# Patient Record
Sex: Male | Born: 1940 | Race: White | Hispanic: No | Marital: Married | Smoking: Former smoker
Health system: Southern US, Community
[De-identification: ages and names within clinical notes are randomized; demographics above are authoritative.]

## PROBLEM LIST (undated history)

## (undated) DIAGNOSIS — IMO0002 Reserved for concepts with insufficient information to code with codable children: Secondary | ICD-10-CM

## (undated) DIAGNOSIS — Z5189 Encounter for other specified aftercare: Secondary | ICD-10-CM

## (undated) DIAGNOSIS — Z860100 Personal history of colon polyps, unspecified: Secondary | ICD-10-CM

## (undated) DIAGNOSIS — K219 Gastro-esophageal reflux disease without esophagitis: Secondary | ICD-10-CM

## (undated) DIAGNOSIS — S42309A Unspecified fracture of shaft of humerus, unspecified arm, initial encounter for closed fracture: Secondary | ICD-10-CM

## (undated) DIAGNOSIS — R972 Elevated prostate specific antigen [PSA]: Secondary | ICD-10-CM

## (undated) DIAGNOSIS — N189 Chronic kidney disease, unspecified: Secondary | ICD-10-CM

## (undated) DIAGNOSIS — G709 Myoneural disorder, unspecified: Secondary | ICD-10-CM

## (undated) DIAGNOSIS — M419 Scoliosis, unspecified: Secondary | ICD-10-CM

## (undated) DIAGNOSIS — K625 Hemorrhage of anus and rectum: Secondary | ICD-10-CM

## (undated) DIAGNOSIS — K279 Peptic ulcer, site unspecified, unspecified as acute or chronic, without hemorrhage or perforation: Secondary | ICD-10-CM

## (undated) DIAGNOSIS — I499 Cardiac arrhythmia, unspecified: Secondary | ICD-10-CM

## (undated) DIAGNOSIS — Z87442 Personal history of urinary calculi: Secondary | ICD-10-CM

## (undated) DIAGNOSIS — J449 Chronic obstructive pulmonary disease, unspecified: Secondary | ICD-10-CM

## (undated) DIAGNOSIS — IMO0001 Reserved for inherently not codable concepts without codable children: Secondary | ICD-10-CM

## (undated) DIAGNOSIS — E785 Hyperlipidemia, unspecified: Secondary | ICD-10-CM

## (undated) DIAGNOSIS — K5730 Diverticulosis of large intestine without perforation or abscess without bleeding: Secondary | ICD-10-CM

## (undated) DIAGNOSIS — K922 Gastrointestinal hemorrhage, unspecified: Secondary | ICD-10-CM

## (undated) DIAGNOSIS — E119 Type 2 diabetes mellitus without complications: Secondary | ICD-10-CM

## (undated) DIAGNOSIS — M1990 Unspecified osteoarthritis, unspecified site: Secondary | ICD-10-CM

## (undated) DIAGNOSIS — K921 Melena: Secondary | ICD-10-CM

## (undated) DIAGNOSIS — K5720 Diverticulitis of large intestine with perforation and abscess without bleeding: Secondary | ICD-10-CM

## (undated) DIAGNOSIS — I10 Essential (primary) hypertension: Secondary | ICD-10-CM

## (undated) DIAGNOSIS — H9210 Otorrhea, unspecified ear: Secondary | ICD-10-CM

## (undated) DIAGNOSIS — Z87440 Personal history of urinary (tract) infections: Secondary | ICD-10-CM

## (undated) DIAGNOSIS — G4730 Sleep apnea, unspecified: Secondary | ICD-10-CM

## (undated) DIAGNOSIS — M069 Rheumatoid arthritis, unspecified: Secondary | ICD-10-CM

## (undated) HISTORY — DX: Personal history of urinary (tract) infections: Z87.440

## (undated) HISTORY — DX: Reserved for concepts with insufficient information to code with codable children: IMO0002

## (undated) HISTORY — DX: Diverticulosis of large intestine without perforation or abscess without bleeding: K57.30

## (undated) HISTORY — DX: Gastro-esophageal reflux disease without esophagitis: K21.9

## (undated) HISTORY — DX: Melena: K92.1

## (undated) HISTORY — DX: Elevated prostate specific antigen (PSA): R97.20

## (undated) HISTORY — DX: Type 2 diabetes mellitus without complications: E11.9

## (undated) HISTORY — DX: Personal history of urinary calculi: Z87.442

## (undated) HISTORY — DX: Personal history of colon polyps, unspecified: Z86.0100

## (undated) HISTORY — DX: Diverticulitis of large intestine with perforation and abscess without bleeding: K57.20

## (undated) HISTORY — DX: Chronic kidney disease, unspecified: N18.9

## (undated) HISTORY — DX: Unspecified osteoarthritis, unspecified site: M19.90

## (undated) HISTORY — DX: Chronic obstructive pulmonary disease, unspecified: J44.9

## (undated) HISTORY — DX: Peptic ulcer, site unspecified, unspecified as acute or chronic, without hemorrhage or perforation: K27.9

## (undated) HISTORY — DX: Essential (primary) hypertension: I10

## (undated) HISTORY — DX: Hemorrhage of anus and rectum: K62.5

## (undated) HISTORY — DX: Hyperlipidemia, unspecified: E78.5

## (undated) HISTORY — PX: OTHER SURGICAL HISTORY: SHX169

## (undated) HISTORY — DX: Encounter for other specified aftercare: Z51.89

## (undated) HISTORY — DX: Gastrointestinal hemorrhage, unspecified: K92.2

## (undated) HISTORY — DX: Personal history of colonic polyps: Z86.010

## (undated) HISTORY — DX: Unspecified fracture of shaft of humerus, unspecified arm, initial encounter for closed fracture: S42.309A

## (undated) HISTORY — DX: Rheumatoid arthritis, unspecified: M06.9

## (undated) HISTORY — DX: Sleep apnea, unspecified: G47.30

## (undated) HISTORY — DX: Reserved for inherently not codable concepts without codable children: IMO0001

## (undated) HISTORY — DX: Otorrhea, unspecified ear: H92.10

---
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
## 1959-03-20 HISTORY — PX: OTHER SURGICAL HISTORY: SHX169

## 2006-12-11 ENCOUNTER — Ambulatory Visit: Payer: Self-pay | Admitting: Internal Medicine

## 2006-12-11 ENCOUNTER — Encounter: Payer: Self-pay | Admitting: Internal Medicine

## 2006-12-11 DIAGNOSIS — E119 Type 2 diabetes mellitus without complications: Secondary | ICD-10-CM

## 2006-12-11 DIAGNOSIS — Z87442 Personal history of urinary calculi: Secondary | ICD-10-CM | POA: Insufficient documentation

## 2006-12-11 DIAGNOSIS — Z860100 Personal history of colon polyps, unspecified: Secondary | ICD-10-CM | POA: Insufficient documentation

## 2006-12-11 DIAGNOSIS — K5730 Diverticulosis of large intestine without perforation or abscess without bleeding: Secondary | ICD-10-CM | POA: Insufficient documentation

## 2006-12-11 DIAGNOSIS — K279 Peptic ulcer, site unspecified, unspecified as acute or chronic, without hemorrhage or perforation: Secondary | ICD-10-CM | POA: Insufficient documentation

## 2006-12-11 DIAGNOSIS — M069 Rheumatoid arthritis, unspecified: Secondary | ICD-10-CM | POA: Insufficient documentation

## 2006-12-11 DIAGNOSIS — I10 Essential (primary) hypertension: Secondary | ICD-10-CM

## 2006-12-11 LAB — CONVERTED CEMR LAB
Albumin: 4.2 g/dL (ref 3.5–5.2)
Alkaline Phosphatase: 80 units/L (ref 39–117)
BUN: 23 mg/dL (ref 6–23)
Chloride: 105 meq/L (ref 96–112)
Cholesterol: 118 mg/dL (ref 0–200)
Creatinine, Ser: 1.2 mg/dL (ref 0.4–1.5)
Hgb A1c MFr Bld: 6.7 % — ABNORMAL HIGH (ref 4.6–6.0)
LDL Cholesterol: 72 mg/dL (ref 0–99)
Potassium: 4.1 meq/L (ref 3.5–5.1)
Sodium: 142 meq/L (ref 135–145)
Total Bilirubin: 0.9 mg/dL (ref 0.3–1.2)
VLDL: 12 mg/dL (ref 0–40)

## 2006-12-13 DIAGNOSIS — E785 Hyperlipidemia, unspecified: Secondary | ICD-10-CM | POA: Insufficient documentation

## 2006-12-26 ENCOUNTER — Ambulatory Visit: Payer: Self-pay

## 2007-01-21 ENCOUNTER — Ambulatory Visit: Payer: Self-pay | Admitting: Internal Medicine

## 2007-04-15 ENCOUNTER — Encounter: Payer: Self-pay | Admitting: Internal Medicine

## 2007-04-16 DIAGNOSIS — Z9089 Acquired absence of other organs: Secondary | ICD-10-CM | POA: Insufficient documentation

## 2007-04-16 DIAGNOSIS — H9210 Otorrhea, unspecified ear: Secondary | ICD-10-CM | POA: Insufficient documentation

## 2007-04-16 DIAGNOSIS — S42309A Unspecified fracture of shaft of humerus, unspecified arm, initial encounter for closed fracture: Secondary | ICD-10-CM

## 2007-04-16 DIAGNOSIS — K219 Gastro-esophageal reflux disease without esophagitis: Secondary | ICD-10-CM

## 2007-04-16 DIAGNOSIS — N390 Urinary tract infection, site not specified: Secondary | ICD-10-CM

## 2007-04-16 DIAGNOSIS — N189 Chronic kidney disease, unspecified: Secondary | ICD-10-CM | POA: Insufficient documentation

## 2007-04-17 ENCOUNTER — Ambulatory Visit: Payer: Self-pay | Admitting: Internal Medicine

## 2007-04-17 DIAGNOSIS — IMO0001 Reserved for inherently not codable concepts without codable children: Secondary | ICD-10-CM

## 2007-04-25 ENCOUNTER — Ambulatory Visit: Payer: Self-pay | Admitting: Internal Medicine

## 2007-04-25 LAB — CONVERTED CEMR LAB: BUN: 21 mg/dL (ref 6–23)

## 2007-04-29 ENCOUNTER — Encounter: Admission: RE | Admit: 2007-04-29 | Discharge: 2007-04-29 | Payer: Self-pay | Admitting: Internal Medicine

## 2007-05-15 ENCOUNTER — Encounter: Payer: Self-pay | Admitting: Internal Medicine

## 2007-07-14 ENCOUNTER — Ambulatory Visit: Payer: Self-pay | Admitting: Internal Medicine

## 2007-07-14 LAB — CONVERTED CEMR LAB
ALT: 23 units/L (ref 0–53)
Albumin: 4.1 g/dL (ref 3.5–5.2)
Bilirubin, Direct: 0.1 mg/dL (ref 0.0–0.3)
CO2: 29 meq/L (ref 19–32)
Calcium: 9.2 mg/dL (ref 8.4–10.5)
Chloride: 109 meq/L (ref 96–112)
Glucose, Bld: 142 mg/dL — ABNORMAL HIGH (ref 70–99)
Hgb A1c MFr Bld: 6.3 % — ABNORMAL HIGH (ref 4.6–6.0)
LDL Cholesterol: 126 mg/dL — ABNORMAL HIGH (ref 0–99)
Potassium: 4.5 meq/L (ref 3.5–5.1)
Sodium: 142 meq/L (ref 135–145)
Total CHOL/HDL Ratio: 5.4
Total Protein: 6.7 g/dL (ref 6.0–8.3)
VLDL: 15 mg/dL (ref 0–40)

## 2007-07-17 ENCOUNTER — Ambulatory Visit: Payer: Self-pay | Admitting: Internal Medicine

## 2007-07-17 DIAGNOSIS — E663 Overweight: Secondary | ICD-10-CM | POA: Insufficient documentation

## 2007-08-29 ENCOUNTER — Telehealth: Payer: Self-pay | Admitting: Internal Medicine

## 2007-09-30 ENCOUNTER — Ambulatory Visit: Payer: Self-pay | Admitting: Internal Medicine

## 2007-11-13 ENCOUNTER — Encounter: Payer: Self-pay | Admitting: Internal Medicine

## 2008-01-15 ENCOUNTER — Ambulatory Visit: Payer: Self-pay | Admitting: Internal Medicine

## 2008-01-15 LAB — CONVERTED CEMR LAB
AST: 20 units/L (ref 0–37)
Alkaline Phosphatase: 97 units/L (ref 39–117)
BUN: 24 mg/dL — ABNORMAL HIGH (ref 6–23)
Basophils Absolute: 0 10*3/uL (ref 0.0–0.1)
Basophils Relative: 0.5 % (ref 0.0–3.0)
Bilirubin Urine: NEGATIVE
CO2: 31 meq/L (ref 19–32)
Cholesterol: 100 mg/dL (ref 0–200)
Eosinophils Relative: 4.1 % (ref 0.0–5.0)
GFR calc Af Amer: 65 mL/min
GFR calc non Af Amer: 54 mL/min
Glucose, Bld: 111 mg/dL — ABNORMAL HIGH (ref 70–99)
Hemoglobin: 14.3 g/dL (ref 13.0–17.0)
Ketones, ur: NEGATIVE mg/dL
LDL Cholesterol: 57 mg/dL (ref 0–99)
MCHC: 34.1 g/dL (ref 30.0–36.0)
MCV: 98.7 fL (ref 78.0–100.0)
Monocytes Relative: 6.9 % (ref 3.0–12.0)
Neutrophils Relative %: 66.3 % (ref 43.0–77.0)
RBC: 4.27 M/uL (ref 4.22–5.81)
RDW: 12.8 % (ref 11.5–14.6)
Sodium: 142 meq/L (ref 135–145)
TSH: 1.36 microintl units/mL (ref 0.35–5.50)
Total CHOL/HDL Ratio: 3.2
Total Protein, Urine: NEGATIVE mg/dL
Triglycerides: 57 mg/dL (ref 0–149)
WBC: 6.6 10*3/uL (ref 4.5–10.5)
pH: 6 (ref 5.0–8.0)

## 2008-01-18 ENCOUNTER — Encounter: Payer: Self-pay | Admitting: Internal Medicine

## 2008-01-22 ENCOUNTER — Ambulatory Visit: Payer: Self-pay | Admitting: Internal Medicine

## 2008-02-15 ENCOUNTER — Inpatient Hospital Stay (HOSPITAL_COMMUNITY): Admission: EM | Admit: 2008-02-15 | Discharge: 2008-02-16 | Payer: Self-pay | Admitting: Emergency Medicine

## 2008-02-20 ENCOUNTER — Ambulatory Visit: Payer: Self-pay | Admitting: Internal Medicine

## 2008-02-20 DIAGNOSIS — K922 Gastrointestinal hemorrhage, unspecified: Secondary | ICD-10-CM | POA: Insufficient documentation

## 2008-02-25 ENCOUNTER — Telehealth: Payer: Self-pay | Admitting: Internal Medicine

## 2008-04-12 ENCOUNTER — Encounter: Payer: Self-pay | Admitting: Internal Medicine

## 2008-04-19 ENCOUNTER — Encounter: Payer: Self-pay | Admitting: Internal Medicine

## 2008-07-05 ENCOUNTER — Telehealth: Payer: Self-pay | Admitting: Internal Medicine

## 2008-07-12 ENCOUNTER — Ambulatory Visit: Payer: Self-pay | Admitting: Internal Medicine

## 2008-07-12 LAB — CONVERTED CEMR LAB
AST: 17 units/L (ref 0–37)
Alkaline Phosphatase: 96 units/L (ref 39–117)
Bilirubin, Direct: 0.2 mg/dL (ref 0.0–0.3)
CO2: 28 meq/L (ref 19–32)
Calcium: 9.3 mg/dL (ref 8.4–10.5)
Chloride: 109 meq/L (ref 96–112)
Cholesterol: 111 mg/dL (ref 0–200)
GFR calc non Af Amer: 45.83 mL/min (ref >60)
HDL: 34.1 mg/dL — ABNORMAL LOW (ref >39.00)
Hgb A1c MFr Bld: 6.3 % (ref 4.6–6.5)
LDL Cholesterol: 69 mg/dL (ref 0–99)
Potassium: 4.8 meq/L (ref 3.5–5.1)
Total Bilirubin: 0.7 mg/dL (ref 0.3–1.2)
Total CHOL/HDL Ratio: 3

## 2008-07-20 ENCOUNTER — Telehealth: Payer: Self-pay | Admitting: Internal Medicine

## 2008-07-21 ENCOUNTER — Ambulatory Visit: Payer: Self-pay | Admitting: Internal Medicine

## 2008-07-21 ENCOUNTER — Encounter: Payer: Self-pay | Admitting: Internal Medicine

## 2008-07-28 ENCOUNTER — Ambulatory Visit: Payer: Self-pay | Admitting: Internal Medicine

## 2008-08-06 ENCOUNTER — Encounter: Payer: Self-pay | Admitting: Pulmonary Disease

## 2008-08-06 ENCOUNTER — Ambulatory Visit (HOSPITAL_BASED_OUTPATIENT_CLINIC_OR_DEPARTMENT_OTHER): Admission: RE | Admit: 2008-08-06 | Discharge: 2008-08-06 | Payer: Self-pay | Admitting: Internal Medicine

## 2008-08-06 ENCOUNTER — Encounter: Payer: Self-pay | Admitting: Internal Medicine

## 2008-08-09 ENCOUNTER — Ambulatory Visit: Payer: Self-pay | Admitting: Pulmonary Disease

## 2008-08-17 ENCOUNTER — Ambulatory Visit: Payer: Self-pay | Admitting: Internal Medicine

## 2008-08-19 ENCOUNTER — Ambulatory Visit: Payer: Self-pay | Admitting: Internal Medicine

## 2008-08-19 ENCOUNTER — Encounter: Payer: Self-pay | Admitting: Internal Medicine

## 2008-08-19 LAB — CONVERTED CEMR LAB
Basophils Relative: 0 % (ref 0.0–3.0)
C3 Complement: 127 mg/dL (ref 88–201)
Hemoglobin: 14.1 g/dL (ref 13.0–17.0)
Lymphs Abs: 1.5 10*3/uL (ref 0.7–4.0)
MCHC: 34.4 g/dL (ref 30.0–36.0)
MCV: 96.3 fL (ref 78.0–100.0)
Monocytes Relative: 8.7 % (ref 3.0–12.0)
Neutro Abs: 4.3 10*3/uL (ref 1.4–7.7)
RBC: 4.27 M/uL (ref 4.22–5.81)
WBC: 6.6 10*3/uL (ref 4.5–10.5)

## 2008-08-20 ENCOUNTER — Telehealth: Payer: Self-pay | Admitting: Internal Medicine

## 2008-08-20 DIAGNOSIS — G4733 Obstructive sleep apnea (adult) (pediatric): Secondary | ICD-10-CM | POA: Insufficient documentation

## 2008-08-23 ENCOUNTER — Ambulatory Visit: Payer: Self-pay | Admitting: Internal Medicine

## 2008-08-23 LAB — CONVERTED CEMR LAB
Collection Interval-CRCL: 24 hr
Creatinine 24 HR UR: 2387 mg/24hr — ABNORMAL HIGH (ref 800–2000)
Creatinine Clearance: 112 mL/min (ref 75–125)
Creatinine, Urine: 82.3 mg/dL
INR: 1 (ref 0.8–1.0)
Total Protein, Urine: 2

## 2008-08-26 ENCOUNTER — Ambulatory Visit: Payer: Self-pay | Admitting: Internal Medicine

## 2008-08-30 ENCOUNTER — Encounter (INDEPENDENT_AMBULATORY_CARE_PROVIDER_SITE_OTHER): Payer: Self-pay | Admitting: *Deleted

## 2008-08-30 ENCOUNTER — Encounter: Payer: Self-pay | Admitting: Internal Medicine

## 2008-09-27 ENCOUNTER — Encounter: Payer: Self-pay | Admitting: Internal Medicine

## 2008-10-20 ENCOUNTER — Encounter: Payer: Self-pay | Admitting: Internal Medicine

## 2008-10-27 ENCOUNTER — Encounter: Payer: Self-pay | Admitting: Internal Medicine

## 2008-10-28 ENCOUNTER — Telehealth: Payer: Self-pay | Admitting: Internal Medicine

## 2008-10-29 ENCOUNTER — Encounter (INDEPENDENT_AMBULATORY_CARE_PROVIDER_SITE_OTHER): Payer: Self-pay | Admitting: *Deleted

## 2008-11-01 ENCOUNTER — Telehealth: Payer: Self-pay | Admitting: Internal Medicine

## 2008-11-08 ENCOUNTER — Ambulatory Visit: Payer: Self-pay | Admitting: Internal Medicine

## 2008-11-08 ENCOUNTER — Telehealth: Payer: Self-pay | Admitting: Internal Medicine

## 2008-11-08 DIAGNOSIS — R1084 Generalized abdominal pain: Secondary | ICD-10-CM

## 2008-11-08 LAB — CONVERTED CEMR LAB
Alkaline Phosphatase: 86 units/L (ref 39–117)
BUN: 36 mg/dL — ABNORMAL HIGH (ref 6–23)
Basophils Relative: 0.7 % (ref 0.0–3.0)
Bilirubin Urine: NEGATIVE
CO2: 29 meq/L (ref 19–32)
Calcium: 9.1 mg/dL (ref 8.4–10.5)
Chloride: 102 meq/L (ref 96–112)
Eosinophils Absolute: 0.1 10*3/uL (ref 0.0–0.7)
Eosinophils Relative: 0.4 % (ref 0.0–5.0)
GFR calc non Af Amer: 42.69 mL/min (ref >60)
Glucose, Bld: 121 mg/dL — ABNORMAL HIGH (ref 70–99)
HDL goal, serum: 40 mg/dL
HDL: 46.7 mg/dL (ref >39.00)
Hemoglobin, Urine: NEGATIVE
Hemoglobin: 13.5 g/dL (ref 13.0–17.0)
LDL Cholesterol: 45 mg/dL (ref 0–99)
LDL Goal: 100 mg/dL
MCV: 98.3 fL (ref 78.0–100.0)
Nitrite: NEGATIVE
RDW: 13.4 % (ref 11.5–14.6)
Sodium: 139 meq/L (ref 135–145)
TSH: 0.98 microintl units/mL (ref 0.35–5.50)
Total CHOL/HDL Ratio: 2
Triglycerides: 54 mg/dL (ref 0.0–149.0)
Urine Glucose: NEGATIVE mg/dL

## 2008-11-09 ENCOUNTER — Encounter: Payer: Self-pay | Admitting: Internal Medicine

## 2008-11-09 ENCOUNTER — Ambulatory Visit: Payer: Self-pay | Admitting: Pulmonary Disease

## 2008-11-09 LAB — CONVERTED CEMR LAB: Lipase: 13 units/L (ref 0–75)

## 2008-11-10 ENCOUNTER — Telehealth: Payer: Self-pay | Admitting: Internal Medicine

## 2008-11-10 ENCOUNTER — Encounter (INDEPENDENT_AMBULATORY_CARE_PROVIDER_SITE_OTHER): Payer: Self-pay | Admitting: General Surgery

## 2008-11-10 ENCOUNTER — Encounter: Payer: Self-pay | Admitting: Internal Medicine

## 2008-11-10 ENCOUNTER — Inpatient Hospital Stay (HOSPITAL_COMMUNITY): Admission: EM | Admit: 2008-11-10 | Discharge: 2008-11-18 | Payer: Self-pay | Admitting: Emergency Medicine

## 2008-11-10 ENCOUNTER — Ambulatory Visit: Payer: Self-pay | Admitting: Internal Medicine

## 2008-11-16 ENCOUNTER — Telehealth: Payer: Self-pay | Admitting: Pulmonary Disease

## 2008-11-25 ENCOUNTER — Telehealth: Payer: Self-pay | Admitting: Internal Medicine

## 2008-11-25 ENCOUNTER — Ambulatory Visit: Payer: Self-pay | Admitting: Internal Medicine

## 2008-11-25 LAB — CONVERTED CEMR LAB
CO2: 28 meq/L (ref 19–32)
Calcium: 9.1 mg/dL (ref 8.4–10.5)
Creatinine, Ser: 1.5 mg/dL (ref 0.4–1.5)

## 2008-12-30 ENCOUNTER — Ambulatory Visit: Payer: Self-pay | Admitting: Internal Medicine

## 2008-12-30 ENCOUNTER — Telehealth: Payer: Self-pay | Admitting: Pulmonary Disease

## 2009-01-13 ENCOUNTER — Ambulatory Visit: Payer: Self-pay | Admitting: Internal Medicine

## 2009-01-20 ENCOUNTER — Encounter: Admission: RE | Admit: 2009-01-20 | Discharge: 2009-01-20 | Payer: Self-pay | Admitting: General Surgery

## 2009-01-27 ENCOUNTER — Ambulatory Visit (HOSPITAL_BASED_OUTPATIENT_CLINIC_OR_DEPARTMENT_OTHER): Admission: RE | Admit: 2009-01-27 | Discharge: 2009-01-27 | Payer: Self-pay | Admitting: Pulmonary Disease

## 2009-01-27 ENCOUNTER — Encounter: Payer: Self-pay | Admitting: Pulmonary Disease

## 2009-01-27 ENCOUNTER — Encounter: Payer: Self-pay | Admitting: Internal Medicine

## 2009-01-31 ENCOUNTER — Encounter: Payer: Self-pay | Admitting: Internal Medicine

## 2009-02-01 ENCOUNTER — Encounter: Payer: Self-pay | Admitting: Internal Medicine

## 2009-02-07 ENCOUNTER — Ambulatory Visit: Payer: Self-pay | Admitting: Pulmonary Disease

## 2009-02-08 ENCOUNTER — Telehealth (INDEPENDENT_AMBULATORY_CARE_PROVIDER_SITE_OTHER): Payer: Self-pay | Admitting: *Deleted

## 2009-02-22 ENCOUNTER — Ambulatory Visit: Payer: Self-pay | Admitting: Pulmonary Disease

## 2009-02-25 ENCOUNTER — Inpatient Hospital Stay (HOSPITAL_COMMUNITY): Admission: RE | Admit: 2009-02-25 | Discharge: 2009-03-02 | Payer: Self-pay | Admitting: General Surgery

## 2009-02-25 ENCOUNTER — Telehealth: Payer: Self-pay | Admitting: Internal Medicine

## 2009-02-25 ENCOUNTER — Ambulatory Visit: Payer: Self-pay | Admitting: Internal Medicine

## 2009-02-25 ENCOUNTER — Encounter (INDEPENDENT_AMBULATORY_CARE_PROVIDER_SITE_OTHER): Payer: Self-pay | Admitting: General Surgery

## 2009-02-25 HISTORY — PX: EXPLORATORY LAPAROTOMY: SUR591

## 2009-03-06 ENCOUNTER — Emergency Department (HOSPITAL_COMMUNITY): Admission: EM | Admit: 2009-03-06 | Discharge: 2009-03-06 | Payer: Self-pay | Admitting: Emergency Medicine

## 2009-03-07 ENCOUNTER — Encounter: Payer: Self-pay | Admitting: Internal Medicine

## 2009-03-08 ENCOUNTER — Encounter: Payer: Self-pay | Admitting: Internal Medicine

## 2009-03-16 ENCOUNTER — Ambulatory Visit: Payer: Self-pay | Admitting: Internal Medicine

## 2009-03-16 LAB — CONVERTED CEMR LAB
BUN: 15 mg/dL (ref 6–23)
Basophils Relative: 0.9 % (ref 0.0–3.0)
Calcium: 9.2 mg/dL (ref 8.4–10.5)
Chloride: 104 meq/L (ref 96–112)
Eosinophils Absolute: 0.4 10*3/uL (ref 0.0–0.7)
Eosinophils Relative: 4.2 % (ref 0.0–5.0)
GFR calc non Af Amer: 58.12 mL/min (ref >60)
Glucose, Bld: 136 mg/dL — ABNORMAL HIGH (ref 70–99)
HCT: 34.4 % — ABNORMAL LOW (ref 39.0–52.0)
Hemoglobin: 11.7 g/dL — ABNORMAL LOW (ref 13.0–17.0)
Lymphocytes Relative: 13.7 % (ref 12.0–46.0)
Lymphs Abs: 1.4 10*3/uL (ref 0.7–4.0)
MCHC: 33.9 g/dL (ref 30.0–36.0)
Neutrophils Relative %: 76.1 % (ref 43.0–77.0)
Potassium: 4.3 meq/L (ref 3.5–5.1)
RDW: 13.1 % (ref 11.5–14.6)
Sodium: 141 meq/L (ref 135–145)
WBC: 10.4 10*3/uL (ref 4.5–10.5)

## 2009-03-17 ENCOUNTER — Encounter: Payer: Self-pay | Admitting: Internal Medicine

## 2009-03-19 HISTORY — PX: CATARACT EXTRACTION, BILATERAL: SHX1313

## 2009-03-19 HISTORY — PX: EXPLORATORY LAPAROTOMY W/ BOWEL RESECTION: SHX1544

## 2009-03-31 ENCOUNTER — Encounter: Payer: Self-pay | Admitting: Internal Medicine

## 2009-04-07 ENCOUNTER — Encounter: Payer: Self-pay | Admitting: Pulmonary Disease

## 2009-05-16 ENCOUNTER — Telehealth: Payer: Self-pay | Admitting: Internal Medicine

## 2009-05-27 ENCOUNTER — Encounter: Payer: Self-pay | Admitting: Internal Medicine

## 2009-06-03 ENCOUNTER — Encounter: Payer: Self-pay | Admitting: Internal Medicine

## 2009-07-06 ENCOUNTER — Telehealth: Payer: Self-pay | Admitting: Internal Medicine

## 2009-07-11 ENCOUNTER — Telehealth: Payer: Self-pay | Admitting: Internal Medicine

## 2009-07-13 ENCOUNTER — Telehealth: Payer: Self-pay | Admitting: Internal Medicine

## 2009-07-14 ENCOUNTER — Encounter: Payer: Self-pay | Admitting: Internal Medicine

## 2009-11-11 ENCOUNTER — Encounter: Payer: Self-pay | Admitting: Internal Medicine

## 2009-11-14 ENCOUNTER — Telehealth: Payer: Self-pay | Admitting: Internal Medicine

## 2009-11-17 ENCOUNTER — Ambulatory Visit: Payer: Self-pay | Admitting: Internal Medicine

## 2009-11-17 LAB — CONVERTED CEMR LAB
ALT: 20 units/L (ref 0–53)
Albumin: 4.3 g/dL (ref 3.5–5.2)
BUN: 24 mg/dL — ABNORMAL HIGH (ref 6–23)
Basophils Relative: 0.6 % (ref 0.0–3.0)
CO2: 28 meq/L (ref 19–32)
Cholesterol: 123 mg/dL (ref 0–200)
Eosinophils Absolute: 0.2 10*3/uL (ref 0.0–0.7)
Eosinophils Relative: 2.1 % (ref 0.0–5.0)
Glucose, Bld: 122 mg/dL — ABNORMAL HIGH (ref 70–99)
Hemoglobin: 13.9 g/dL (ref 13.0–17.0)
Hgb A1c MFr Bld: 6.6 % — ABNORMAL HIGH (ref 4.6–6.5)
LDL Cholesterol: 71 mg/dL (ref 0–99)
Lymphocytes Relative: 22.2 % (ref 12.0–46.0)
Monocytes Absolute: 0.5 10*3/uL (ref 0.1–1.0)
Potassium: 4.7 meq/L (ref 3.5–5.1)
Sodium: 140 meq/L (ref 135–145)
Total Bilirubin: 0.8 mg/dL (ref 0.3–1.2)
Total CHOL/HDL Ratio: 3
Total Protein: 6.6 g/dL (ref 6.0–8.3)
WBC: 7.2 10*3/uL (ref 4.5–10.5)

## 2009-11-21 ENCOUNTER — Encounter: Payer: Self-pay | Admitting: Internal Medicine

## 2009-12-08 ENCOUNTER — Telehealth: Payer: Self-pay | Admitting: Internal Medicine

## 2010-01-24 ENCOUNTER — Ambulatory Visit: Payer: Self-pay | Admitting: Internal Medicine

## 2010-03-04 ENCOUNTER — Ambulatory Visit: Payer: Self-pay | Admitting: Family Medicine

## 2010-04-18 NOTE — Progress Notes (Signed)
Summary: LABS   Phone Note Call from Patient   Summary of Call: Patient is requesting to come in for labs. He knows he is due for A1c and wants to know if there is anything else MD advises. Also has lab order from rheumatologist that he will also need.  Initial call taken by: Lamar Sprinkles, CMA,  November 14, 2009 9:51 AM  Follow-up for Phone Call        ok for labs: A1C 250.00, lipid panel 272.4, hepatic 995.20, CBC 995.20 Follow-up by: Jacques Navy MD,  November 14, 2009 12:32 PM  Additional Follow-up for Phone Call Additional follow up Details #1::        Pt also needs CMET CBC CRP & Sed Rate per Dr Jimmy Footman. Added BMP, CRP & Sed Rate to above labs. Pt informed, we will fax results to Rheumatologist when avail.  Additional Follow-up by: Lamar Sprinkles, CMA,  November 14, 2009 2:50 PM

## 2010-04-18 NOTE — Progress Notes (Signed)
  Phone Note Refill Request Message from:  Fax from Pharmacy on July 11, 2009 4:31 PM  Refills Requested: Medication #1:  GLUCOPHAGE 500 MG TABS 1 tab once daily. Initial call taken by: Ami Bullins CMA,  July 11, 2009 4:31 PM    Prescriptions: GLUCOPHAGE 500 MG TABS (METFORMIN HCL) 1 tab once daily  #180 x 3   Entered by:   Ami Bullins CMA   Authorized by:   Jacques Navy MD   Signed by:   Bill Salinas CMA on 07/11/2009   Method used:   Electronically to        [facility] (retail)       [address]       [place], [place]  [address]       Ph: [phone]       Fax: [phone]   RxID:   [id number]

## 2010-04-18 NOTE — Progress Notes (Signed)
      Vision Screening:      Vision Comments: 02/08/2011Eye Exam  by Dr Mateo Flow at [facility]. No evidence of diabetic retinopathy. No evidence of diabetic macular edema. Recommended dilated eye exam in one year.  Hearing Screen

## 2010-04-18 NOTE — Letter (Signed)
Summary: [facility]   Imported By: Sherian Rein 06/21/2009 09:44:17  _____________________________________________________________________  External Attachment:    Type:   Image     Comment:   External Document

## 2010-04-18 NOTE — Letter (Signed)
Summary: [facility]   Imported By: Sherian Rein 07/27/2009 09:22:54  _____________________________________________________________________  External Attachment:    Type:   Image     Comment:   External Document

## 2010-04-18 NOTE — Letter (Signed)
Summary: [facility]   Imported By: Lester Bergen 03/28/2009 10:14:22  _____________________________________________________________________  External Attachment:    Type:   Image     Comment:   External Document

## 2010-04-18 NOTE — Letter (Signed)
[facility] [address] [place], [place]  [address] Phone: [phone]      November 22, 2009   [name] [address] [place], [place] [address]  RE:  LAB RESULTS  Dear  [name],  The following is an interpretation of your most recent lab tests.  Please take note of any instructions provided or changes to medications that have resulted from your lab work.  ELECTROLYTES:  Good - no changes needed  KIDNEY FUNCTION TESTS:  Good - no changes needed  LIVER FUNCTION TESTS:  Good - no changes needed  LIPID PANEL:  Good - no changes needed Triglyceride: 62.0   Cholesterol: 123   LDL: 71   HDL: 39.30   Chol/HDL%:  3   DIABETIC STUDIES:  Excellent - no changes needed Blood Glucose: 122   HgbA1C: 6.6     CBC:  Good - no changes needed  Sed rate and C-reactive protein both normal range.   I hope that you are feeling as well as the reports I receive and lab results would indicate.   Sincerely Yours,    Jacques Navy MD  Patient: Jon Hayden Note: All result statuses are Final unless otherwise noted.  Tests: (1) Hemoglobin A1C (A1C)   Hemoglobin A1C       [H]  6.6 %                       4.6-6.5     Glycemic Control Guidelines for People with Diabetes:     Non Diabetic:  <6%     Goal of Therapy: <7%     Additional Action Suggested:  >8%   Tests: (2) Lipid Panel (LIPID)   Cholesterol               123 mg/dL                   2-956     ATP III Classification            Desirable:  < 200 mg/dL                    Borderline High:  200 - 239 mg/dL               High:  > = 240 mg/dL   Triglycerides             62.0 mg/dL                  2.1-308.6     Normal:  <150 mg/dL     Borderline High:  578 - 199 mg/dL   HDL                       46.96 mg/dL                 >29.52   VLDL Cholesterol          12.4 mg/dL                  8.4-13.2   LDL Cholesterol           71 mg/dL                    4-40  CHO/HDL Ratio:  CHD Risk            3  Men          Women     1/2 Average Risk     3.4          3.3     Average Risk          5.0          4.4     2X Average Risk          9.6          7.1     3X Average Risk          15.0          11.0                           Tests: (3) Hepatic/Liver Function Panel (HEPATIC)   Total Bilirubin           0.8 mg/dL                   0.4-5.4   Direct Bilirubin          0.2 mg/dL                   0.9-8.1   Alkaline Phosphatase      109 U/L                     39-117   AST                       19 U/L                      0-37   ALT                       20 U/L                      0-53   Total Protein             6.6 g/dL                    1.9-1.4   Albumin                   4.3 g/dL                    7.8-2.9  Tests: (4) CBC Platelet w/Diff (CBCD)   White Cell Count          7.2 K/uL                    4.5-10.5   Red Cell Count            4.34 Mil/uL                 4.22-5.81   Hemoglobin                13.9 g/dL                   56.2-13.0   Hematocrit                40.9 %                      39.0-52.0   MCV  94.1 fl                     78.0-100.0   MCHC                      34.1 g/dL                   60.4-54.0   RDW                  [H]  15.7 %                      11.5-14.6   Platelet Count            190.0 K/uL                  150.0-400.0   Neutrophil %              67.9 %                      43.0-77.0   Lymphocyte %              22.2 %                      12.0-46.0   Monocyte %                7.2 %                       3.0-12.0   Eosinophils%              2.1 %                       0.0-5.0   Basophils %               0.6 %                       0.0-3.0   Neutrophill Absolute      4.9 K/uL                    1.4-7.7   Lymphocyte Absolute       1.6 K/uL                    0.7-4.0   Monocyte Absolute         0.5 K/uL                    0.1-1.0  Eosinophils, Absolute                             0.2 K/uL                     0.0-0.7   Basophils Absolute        0.0 K/uL                    0.0-0.1  Tests: (5) BMP (METABOL)   Sodium                    140 mEq/L                   135-145   Potassium  4.7 mEq/L                   3.5-5.1   Chloride                  103 mEq/L                   96-112   Carbon Dioxide            28 mEq/L                    19-32   Glucose              [H]  122 mg/dL                   16-10   BUN                  [H]  24 mg/dL                    9-60   Creatinine                1.4 mg/dL                   4.5-4.0   Calcium                   9.4 mg/dL                   9.8-11.9   GFR                       55.06 mL/min                >60  Tests: (6) Sed Rate (ESR)   Sed Rate                  9 mm/hr                     0-22  Tests: (7) Full Range CRP (FCRP)   CRPH                      0.63 mg/L                   0.00-5.00

## 2010-04-18 NOTE — Progress Notes (Signed)
  Phone Note Call from Patient   Caller: Patient Summary of Call: Patient called staing that his rx for [facility] with [facility] dosage. Per pt this should be 500mg  two times a day and [facility]. Corrected rx called in and medication and pharmacy correct in EMR. Pt is aware Initial call taken by: Rock Nephew CMA,  July 13, 2009 8:51 AM    New/Updated Medications: GLUCOPHAGE 500 MG TABS (METFORMIN HCL) Take 1 tablet by mouth two times a day Prescriptions: GLUCOPHAGE 500 MG TABS (METFORMIN HCL) Take 1 tablet by mouth two times a day  #180 x 3   Entered by:   Rock Nephew CMA   Authorized by:   Jacques Navy MD   Signed by:   Rock Nephew CMA on 07/13/2009   Method used:   Telephoned to ...       [facility] [address] (retail)       [address]       [place], [place]  [address]       Ph: [phone]       Fax: [phone]   RxID:   [id number]

## 2010-04-18 NOTE — Letter (Signed)
Summary: Wound Infection/CCS  Wound Infection/CCS   Imported By: Sherian Rein 03/23/2009 09:06:34  _____________________________________________________________________  External Attachment:    Type:   Image     Comment:   External Document

## 2010-04-18 NOTE — Letter (Signed)
Summary: [facility]   Imported By: Lester [place] 11/22/2009 10:06:34  _____________________________________________________________________  External Attachment:    Type:   Image     Comment:   External Document

## 2010-04-18 NOTE — Progress Notes (Signed)
  Phone Note Other Incoming   Caller: pt Summary of Call: pt called and wanted to know why his metformin was denied. I explained to pt that the medication was removed by Dr Yetta Barre Aug 28,2010 due to side effects of abd cramps. He states that he has never stopped taking medication is it ok to send medication in to [facility]? Please Advise Initial call taken by: Ami Bullins CMA,  July 06, 2009 8:47 AM  Follow-up for Phone Call        yes, please renew metformin as needed. Follow-up by: Jacques Navy MD,  July 06, 2009 12:58 PM    New/Updated Medications: GLUCOPHAGE 500 MG TABS (METFORMIN HCL) 1 tab once daily Prescriptions: GLUCOPHAGE 500 MG TABS (METFORMIN HCL) 1 tab once daily  #90 x 3   Entered by:   Ami Bullins CMA   Authorized by:   Jacques Navy MD   Signed by:   Bill Salinas CMA on 07/06/2009   Method used:   Re-Faxed to ...       [facility] (retail)       [address]       [place], [place]  [address]       Ph: [phone]       Fax: [phone]   RxID:   [id number] GLUCOPHAGE 500 MG TABS (METFORMIN HCL) 1 tab once daily  #90 x 3   Entered by:   Bill Salinas CMA   Authorized by:   Jacques Navy MD   Signed by:   Bill Salinas CMA on 07/06/2009   Method used:   Electronically to        [facility] (retail)       [address]       [place], [place]  [address]       Ph: [phone]       Fax: [phone]   RxID:   [id number]

## 2010-04-18 NOTE — Progress Notes (Signed)
  Phone Note Refill Request Message from:  Fax from Pharmacy on December 08, 2009 2:44 PM  Refills Requested: Medication #1:  CARTIA XT 300 MG XR24H-CAP Take 1 tablet by mouth once a day Initial call taken by: Ami Bullins CMA,  December 08, 2009 2:44 PM    Prescriptions: CARTIA XT 300 MG XR24H-CAP (DILTIAZEM HCL COATED BEADS) Take 1 tablet by mouth once a day  #30 x 6   Entered by:   Ami Bullins CMA   Authorized by:   Jacques Navy MD   Signed by:   Bill Salinas CMA on 12/08/2009   Method used:   Electronically to        [facility] [address] (retail)       [address]       [place], [place]  [address]       Ph: [phone]       Fax: [phone]   RxID:   [id number]

## 2010-04-18 NOTE — Assessment & Plan Note (Signed)
Summary: FLU SHOT/NWS  Nurse Visit   Vital Signs:  Patient profile:   70 year old male Temp:     96.8 degrees F oral  Vitals Entered By: Lanier Prude, CMA(AAMA) (January 24, 2010 8:30 AM)  Allergies: 1)  ! Penicillin 2)  ! * Amlodipine  Orders Added: 1)  Admin 1st Vaccine [[address]] 2)  Flu Vaccine 76yrs + [[address]]          Flu Vaccine Consent Questions     Do you have a history of severe allergic reactions to this vaccine? no    Any prior history of allergic reactions to egg and/or gelatin? no    Do you have a sensitivity to the preservative Thimersol? no    Do you have a past history of Guillan-Barre Syndrome? no    Do you currently have an acute febrile illness? no    Have you ever had a severe reaction to latex? no    Vaccine information given and explained to patient? yes    Are you currently pregnant? no    Lot Number:[id number]   Exp Date:09/16/2010   Site Given  Left Deltoid IM Lanier Prude, [facility])  January 24, 2010 8:40 AM

## 2010-04-18 NOTE — Letter (Signed)
Summary: [facility]   Imported By: Sherian Rein 06/07/2009 14:58:13  _____________________________________________________________________  External Attachment:    Type:   Image     Comment:   External Document

## 2010-04-18 NOTE — Letter (Signed)
Summary: [facility]   Imported By: Lester Arbutus 04/12/2009 15:11:39  _____________________________________________________________________  External Attachment:    Type:   Image     Comment:   External Document

## 2010-04-18 NOTE — Letter (Signed)
Summary: [facility]   Imported By: Sherian Rein 04/04/2009 14:08:53  _____________________________________________________________________  External Attachment:    Type:   Image     Comment:   External Document

## 2010-04-20 NOTE — Assessment & Plan Note (Signed)
Summary: USED TO BE STOMA IS BLEEDING/DLO   Vital Signs:  Patient profile:   70 year old male Weight:      270 pounds BMI:     33.87 Temp:     97.9 degrees F oral BP sitting:   126 / 80  (left arm)  Vitals Entered By: Doristine Devoid CMA (March 04, 2010 12:06 PM) CC: Bleeding on LLQ where stoma was removed    History of Present Illness: Saturday walkin clinic.  Bleeding this AM from prior stoma site L abdomen.  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 No abd. pain.  This AM slight bleeding from prior stoma site. No fever.  Takes aspirin.  No coumadin.  denies fever, chills, or any purulent drainage.  Current Medications (verified): 1)  Fish Oil 1200 Mg  Caps (Omega-3 Fatty Acids) .... Take 1 Tablet By Mouth Once A Day 2)  Niacin Flush Free 500 Mg  Caps (Inositol Niacinate) .... Two Times A Day 3)  Actos 30 Mg  Tabs (Pioglitazone Hcl) .... Once Daily 4)  Furosemide 40 Mg  Tabs (Furosemide) .... Once Daily 5)  Cartia Xt 300 Mg Xr24h-Cap (Diltiazem Hcl Coated Beads) .... Take 1 Tablet By Mouth Once A Day 6)  Hydroxychloroquine Sulfate 200 Mg  Tabs (Hydroxychloroquine Sulfate) .... Take 1 Tablet By Mouth Two Times A Day 7)  Nexium 40 Mg  Cpdr (Esomeprazole Magnesium) .... Q Am 8)  Vitamin D 2000 Unit  Tabs (Cholecalciferol) .... Take 1 Tablet By Mouth Once A Day 9)  Vitamin C 1000 Mg  Tabs (Ascorbic Acid) .Marland Kitchen.. 1 By Mouth Once Daily 10)  Multivitamins   Tabs (Multiple Vitamin) .Marland Kitchen.. 1 By Mouth Once Daily 11)  Onetouch Ultra Test   Strp (Glucose Blood) .... As Directed 12)  Micardis 80 Mg Tabs (Telmisartan) .Marland Kitchen.. 1 By Mouth Once Daily 13)  Lipitor 20 Mg  Tabs (Atorvastatin Calcium) .Marland Kitchen.. 1 By Mouth Qpm 14)  Aspirin Low Dose 81 Mg Tabs (Aspirin) .... Take 1 Tablet By Mouth Once A Day 15)  Glucophage 500 Mg Tabs (Metformin Hcl) .... Take 1 Tablet By Mouth Two Times A Day  Allergies (verified): 1)  ! Penicillin 2)  ! 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    Physical Exam  General:  Well-developed,well-nourished,in no acute distress; alert,appropriate and cooperative throughout examination Lungs:  Normal respiratory effort, chest expands symmetrically. Lungs are clear to auscultation, no crackles or wheezes. Heart:  normal rate and regular rhythm.   Abdomen:  left abdomen at site of scar from previous colostomy stoma punctate area about 3-4 mm diameter and increased granulation tissue and not actively bleeding. Abdomen is soft and nontender. No masses. No guarding or rebound.  No surrounding erythema and no purulent drainage..   Impression & Recommendations:  Problem # 1:  GRANULATION TISSUE, ABNORMAL (ICD-701.5) treated with silver nitrate.  Wound cleaned and and topical antibiotic and dressing applied. Wound care instruction reviewed  Complete Medication List: 1)  Fish Oil 1200 Mg Caps (Omega-3 fatty acids) .Marland KitchenMarland KitchenMarland Kitchen  Take 1 tablet by mouth once a day 2)  Niacin Flush Free 500 Mg Caps (Inositol niacinate) .... Two times a day 3)  Actos 30 Mg Tabs (Pioglitazone hcl) .... Once daily 4)  Furosemide 40 Mg Tabs (Furosemide) .... Once daily 5)  Cartia Xt 300 Mg Xr24h-cap (Diltiazem hcl coated beads) .... Take 1 tablet by mouth once a day 6)  Hydroxychloroquine Sulfate 200 Mg Tabs (Hydroxychloroquine sulfate) .... Take 1  tablet by mouth two times a day 7)  Nexium 40 Mg Cpdr (Esomeprazole magnesium) .... Q am 8)  Vitamin D 2000 Unit Tabs (cholecalciferol)  .... Take 1 tablet by mouth once a day 9)  Vitamin C 1000 Mg Tabs (Ascorbic acid) .Marland Kitchen.. 1 by mouth once daily 10)  Multivitamins Tabs (Multiple vitamin) .Marland Kitchen.. 1 by mouth once daily 11)  Onetouch Ultra Test Strp (Glucose blood) .... As directed 12)  Micardis 80 Mg Tabs (Telmisartan) .Marland Kitchen.. 1 by mouth once daily 13)  Lipitor 20 Mg Tabs (Atorvastatin calcium) .Marland Kitchen.. 1 by mouth qpm 14)  Aspirin Low Dose 81 Mg Tabs (Aspirin) .... Take 1 tablet by mouth once a day 15)  Glucophage 500 Mg Tabs (Metformin hcl) .... Take 1 tablet by mouth two times a day  Patient Instructions: 1)  clean daily with saline and topical antibiotic. Follow up promptly for any redness, pain, or increased drainage 2)  Follow up with Dr Debby Bud if bleeding persists.   Orders Added: 1)  Est. Patient Level III [[address]]

## 2010-04-21 NOTE — Letter (Signed)
Summary: [facility] for [facility]lies/[facility]  [facility] Supplies/[facility]   Imported By: Sherian Rein 04/14/2009 08:44:54  _____________________________________________________________________  External Attachment:    Type:   Image     Comment:   External Document

## 2010-06-01 ENCOUNTER — Telehealth (INDEPENDENT_AMBULATORY_CARE_PROVIDER_SITE_OTHER): Payer: Self-pay | Admitting: *Deleted

## 2010-06-06 NOTE — Progress Notes (Signed)
----   Converted from flag ---- ---- 05/30/2010 9:30 PM, Jacques Navy MD wrote: routine CPX labs and A1C 250.00  Thanks  ---- 05/30/2010 10:27 AM, Hilarie Fredrickson wrote: Mr Golob is coming on March 26 for a physical. He is coming for cpx labs on the 22nd.  He also wants a fu on dm.  Are their any labs to add to the cpx lab order for the dm?  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 ------------------------------

## 2010-06-08 ENCOUNTER — Ambulatory Visit: Payer: BC Managed Care – PPO

## 2010-06-08 DIAGNOSIS — E119 Type 2 diabetes mellitus without complications: Secondary | ICD-10-CM

## 2010-06-08 DIAGNOSIS — Z0000 Encounter for general adult medical examination without abnormal findings: Secondary | ICD-10-CM | POA: Insufficient documentation

## 2010-06-08 DIAGNOSIS — Z0389 Encounter for observation for other suspected diseases and conditions ruled out: Secondary | ICD-10-CM

## 2010-06-08 LAB — PSA: PSA: 0.48 ng/mL (ref 0.10–4.00)

## 2010-06-08 LAB — URINALYSIS
Hgb urine dipstick: NEGATIVE
Leukocytes, UA: NEGATIVE
Specific Gravity, Urine: 1.015 (ref 1.000–1.030)
Urobilinogen, UA: 0.2 (ref 0.0–1.0)

## 2010-06-08 LAB — TSH: TSH: 1.34 u[IU]/mL (ref 0.35–5.50)

## 2010-06-08 LAB — BASIC METABOLIC PANEL
BUN: 24 mg/dL — ABNORMAL HIGH (ref 6–23)
CO2: 27 mEq/L (ref 19–32)
Chloride: 103 mEq/L (ref 96–112)
Creatinine, Ser: 1.4 mg/dL (ref 0.4–1.5)
Glucose, Bld: 95 mg/dL (ref 70–99)

## 2010-06-08 LAB — CBC WITH DIFFERENTIAL/PLATELET
Basophils Relative: 0.5 % (ref 0.0–3.0)
Eosinophils Relative: 2.4 % (ref 0.0–5.0)
HCT: 40.1 % (ref 39.0–52.0)
MCV: 97.7 fl (ref 78.0–100.0)
Monocytes Absolute: 0.4 10*3/uL (ref 0.1–1.0)
Monocytes Relative: 6.5 % (ref 3.0–12.0)
Neutrophils Relative %: 70.7 % (ref 43.0–77.0)
Platelets: 185 10*3/uL (ref 150.0–400.0)
RBC: 4.11 Mil/uL — ABNORMAL LOW (ref 4.22–5.81)
WBC: 6.5 10*3/uL (ref 4.5–10.5)

## 2010-06-08 LAB — HEMOGLOBIN A1C: Hgb A1c MFr Bld: 5.7 % (ref 4.6–6.5)

## 2010-06-08 LAB — LIPID PANEL
LDL Cholesterol: 58 mg/dL (ref 0–99)
Total CHOL/HDL Ratio: 3
VLDL: 12 mg/dL (ref 0.0–40.0)

## 2010-06-08 LAB — HEPATIC FUNCTION PANEL
Bilirubin, Direct: 0.1 mg/dL (ref 0.0–0.3)
Total Bilirubin: 0.5 mg/dL (ref 0.3–1.2)
Total Protein: 6.6 g/dL (ref 6.0–8.3)

## 2010-06-10 ENCOUNTER — Other Ambulatory Visit: Payer: Self-pay | Admitting: Internal Medicine

## 2010-06-12 ENCOUNTER — Encounter: Payer: Self-pay | Admitting: Internal Medicine

## 2010-06-12 ENCOUNTER — Ambulatory Visit (INDEPENDENT_AMBULATORY_CARE_PROVIDER_SITE_OTHER): Payer: Medicare Other | Admitting: Internal Medicine

## 2010-06-12 VITALS — BP 122/64 | HR 64 | Temp 97.7°F | Wt 249.0 lb

## 2010-06-12 DIAGNOSIS — Z23 Encounter for immunization: Secondary | ICD-10-CM

## 2010-06-12 DIAGNOSIS — Z0000 Encounter for general adult medical examination without abnormal findings: Secondary | ICD-10-CM

## 2010-06-12 DIAGNOSIS — Z136 Encounter for screening for cardiovascular disorders: Secondary | ICD-10-CM

## 2010-06-12 MED ORDER — ESOMEPRAZOLE MAGNESIUM 40 MG PO PACK: 40.0000 mg | PACK | Freq: Every day | ORAL | Status: DC

## 2010-06-12 MED ORDER — TELMISARTAN 80 MG PO TABS: 80.0000 mg | ORAL_TABLET | Freq: Every day | ORAL | Status: DC

## 2010-06-12 MED ORDER — ATORVASTATIN CALCIUM 20 MG PO TABS: 20.0000 mg | ORAL_TABLET | Freq: Every day | ORAL | Status: DC

## 2010-06-12 MED ORDER — DILTIAZEM HCL ER COATED BEADS 300 MG PO CP24: 300.0000 mg | ORAL_CAPSULE | Freq: Every day | ORAL | Status: DC

## 2010-06-12 MED ORDER — HYDROXYCHLOROQUINE SULFATE 200 MG PO TABS: 200.0000 mg | ORAL_TABLET | Freq: Two times a day (BID) | ORAL | Status: DC
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
## 2010-06-16 ENCOUNTER — Telehealth: Payer: Self-pay | Admitting: *Deleted

## 2010-06-16 MED ORDER — PIOGLITAZONE HCL 30 MG PO TABS: 30.0000 mg | ORAL_TABLET | Freq: Every day | ORAL | Status: DC

## 2010-06-16 MED ORDER — FUROSEMIDE 40 MG PO TABS: 40.0000 mg | ORAL_TABLET | Freq: Every day | ORAL | Status: DC

## 2010-06-16 NOTE — Telephone Encounter (Signed)
REfills

## 2010-06-20 LAB — CBC
HCT: 35.8 % — ABNORMAL LOW (ref 39.0–52.0)
HCT: 41 % (ref 39.0–52.0)
Hemoglobin: 12.1 g/dL — ABNORMAL LOW (ref 13.0–17.0)
MCHC: 34.1 g/dL (ref 30.0–36.0)
MCV: 96.2 fL (ref 78.0–100.0)
MCV: 96.4 fL (ref 78.0–100.0)
Platelets: 168 10*3/uL (ref 150–400)
Platelets: 196 10*3/uL (ref 150–400)
RBC: 3.71 MIL/uL — ABNORMAL LOW (ref 4.22–5.81)
RBC: 4.26 MIL/uL (ref 4.22–5.81)
RDW: 13.6 % (ref 11.5–15.5)
WBC: 8.4 10*3/uL (ref 4.0–10.5)
WBC: 8.8 10*3/uL (ref 4.0–10.5)
WBC: 9 10*3/uL (ref 4.0–10.5)

## 2010-06-20 LAB — URINALYSIS, ROUTINE W REFLEX MICROSCOPIC
Hgb urine dipstick: NEGATIVE
Nitrite: NEGATIVE
Protein, ur: NEGATIVE mg/dL
Urobilinogen, UA: 0.2 mg/dL (ref 0.0–1.0)

## 2010-06-20 LAB — COMPREHENSIVE METABOLIC PANEL
AST: 22 U/L (ref 0–37)
Albumin: 4.5 g/dL (ref 3.5–5.2)
Alkaline Phosphatase: 102 U/L (ref 39–117)
BUN: 20 mg/dL (ref 6–23)
CO2: 28 mEq/L (ref 19–32)
Chloride: 104 mEq/L (ref 96–112)
Creatinine, Ser: 1.23 mg/dL (ref 0.4–1.5)
GFR calc non Af Amer: 58 mL/min — ABNORMAL LOW (ref >60)
Potassium: 4.2 mEq/L (ref 3.5–5.1)
Total Bilirubin: 0.6 mg/dL (ref 0.3–1.2)

## 2010-06-20 LAB — BASIC METABOLIC PANEL
BUN: 12 mg/dL (ref 6–23)
Chloride: 106 mEq/L (ref 96–112)
Creatinine, Ser: 1.18 mg/dL (ref 0.4–1.5)
GFR calc non Af Amer: 57 mL/min — ABNORMAL LOW (ref >60)
Glucose, Bld: 115 mg/dL — ABNORMAL HIGH (ref 70–99)
Potassium: 4.4 mEq/L (ref 3.5–5.1)
Sodium: 139 mEq/L (ref 135–145)

## 2010-06-20 LAB — GLUCOSE, CAPILLARY
Glucose-Capillary: 117 mg/dL — ABNORMAL HIGH (ref 70–99)
Glucose-Capillary: 118 mg/dL — ABNORMAL HIGH (ref 70–99)
Glucose-Capillary: 122 mg/dL — ABNORMAL HIGH (ref 70–99)
Glucose-Capillary: 123 mg/dL — ABNORMAL HIGH (ref 70–99)
Glucose-Capillary: 125 mg/dL — ABNORMAL HIGH (ref 70–99)
Glucose-Capillary: 127 mg/dL — ABNORMAL HIGH (ref 70–99)
Glucose-Capillary: 132 mg/dL — ABNORMAL HIGH (ref 70–99)
Glucose-Capillary: 134 mg/dL — ABNORMAL HIGH (ref 70–99)
Glucose-Capillary: 136 mg/dL — ABNORMAL HIGH (ref 70–99)
Glucose-Capillary: 140 mg/dL — ABNORMAL HIGH (ref 70–99)
Glucose-Capillary: 140 mg/dL — ABNORMAL HIGH (ref 70–99)
Glucose-Capillary: 141 mg/dL — ABNORMAL HIGH (ref 70–99)
Glucose-Capillary: 154 mg/dL — ABNORMAL HIGH (ref 70–99)
Glucose-Capillary: 160 mg/dL — ABNORMAL HIGH (ref 70–99)
Glucose-Capillary: 177 mg/dL — ABNORMAL HIGH (ref 70–99)
Glucose-Capillary: 96 mg/dL (ref 70–99)
Glucose-Capillary: 96 mg/dL (ref 70–99)

## 2010-06-20 LAB — DIFFERENTIAL
Basophils Absolute: 0 10*3/uL (ref 0.0–0.1)
Basophils Relative: 0 % (ref 0–1)
Eosinophils Absolute: 0.3 10*3/uL (ref 0.0–0.7)
Monocytes Relative: 7 % (ref 3–12)
Neutrophils Relative %: 72 % (ref 43–77)

## 2010-06-20 LAB — TYPE AND SCREEN: ABO/RH(D): O NEG

## 2010-06-23 LAB — CBC
HCT: 33.2 % — ABNORMAL LOW (ref 39.0–52.0)
HCT: 33.7 % — ABNORMAL LOW (ref 39.0–52.0)
Hemoglobin: 11.4 g/dL — ABNORMAL LOW (ref 13.0–17.0)
Hemoglobin: 11.7 g/dL — ABNORMAL LOW (ref 13.0–17.0)
MCV: 95.7 fL (ref 78.0–100.0)
MCV: 95.9 fL (ref 78.0–100.0)
Platelets: 292 10*3/uL (ref 150–400)
Platelets: 300 10*3/uL (ref 150–400)
RDW: 14.4 % (ref 11.5–15.5)
WBC: 10.3 10*3/uL (ref 4.0–10.5)
WBC: 12.2 10*3/uL — ABNORMAL HIGH (ref 4.0–10.5)

## 2010-06-23 LAB — BASIC METABOLIC PANEL
BUN: 14 mg/dL (ref 6–23)
BUN: 15 mg/dL (ref 6–23)
Chloride: 102 mEq/L (ref 96–112)
Chloride: 105 mEq/L (ref 96–112)
GFR calc non Af Amer: 55 mL/min — ABNORMAL LOW (ref >60)
Glucose, Bld: 129 mg/dL — ABNORMAL HIGH (ref 70–99)
Potassium: 4 mEq/L (ref 3.5–5.1)
Potassium: 4.2 mEq/L (ref 3.5–5.1)
Sodium: 139 mEq/L (ref 135–145)
Sodium: 140 mEq/L (ref 135–145)

## 2010-06-23 LAB — DIFFERENTIAL
Eosinophils Absolute: 0.3 10*3/uL (ref 0.0–0.7)
Eosinophils Relative: 3 % (ref 0–5)
Lymphocytes Relative: 18 % (ref 12–46)
Lymphs Abs: 1.8 10*3/uL (ref 0.7–4.0)
Monocytes Absolute: 0.7 10*3/uL (ref 0.1–1.0)
Monocytes Relative: 7 % (ref 3–12)

## 2010-06-23 LAB — GLUCOSE, CAPILLARY
Glucose-Capillary: 145 mg/dL — ABNORMAL HIGH (ref 70–99)
Glucose-Capillary: 149 mg/dL — ABNORMAL HIGH (ref 70–99)

## 2010-06-24 LAB — CBC
HCT: 33.8 % — ABNORMAL LOW (ref 39.0–52.0)
HCT: 34.2 % — ABNORMAL LOW (ref 39.0–52.0)
HCT: 34.4 % — ABNORMAL LOW (ref 39.0–52.0)
HCT: 37 % — ABNORMAL LOW (ref 39.0–52.0)
Hemoglobin: 11.4 g/dL — ABNORMAL LOW (ref 13.0–17.0)
Hemoglobin: 11.7 g/dL — ABNORMAL LOW (ref 13.0–17.0)
Hemoglobin: 12.7 g/dL — ABNORMAL LOW (ref 13.0–17.0)
MCHC: 33.2 g/dL (ref 30.0–36.0)
MCHC: 34 g/dL (ref 30.0–36.0)
MCHC: 34.2 g/dL (ref 30.0–36.0)
MCHC: 34.2 g/dL (ref 30.0–36.0)
MCV: 95.8 fL (ref 78.0–100.0)
MCV: 96.4 fL (ref 78.0–100.0)
Platelets: 144 10*3/uL — ABNORMAL LOW (ref 150–400)
Platelets: 177 10*3/uL (ref 150–400)
Platelets: 207 10*3/uL (ref 150–400)
RBC: 3.49 MIL/uL — ABNORMAL LOW (ref 4.22–5.81)
RBC: 3.84 MIL/uL — ABNORMAL LOW (ref 4.22–5.81)
RDW: 14.3 % (ref 11.5–15.5)
RDW: 14.4 % (ref 11.5–15.5)
RDW: 14.7 % (ref 11.5–15.5)
RDW: 14.7 % (ref 11.5–15.5)
RDW: 14.9 % (ref 11.5–15.5)
WBC: 11.5 10*3/uL — ABNORMAL HIGH (ref 4.0–10.5)

## 2010-06-24 LAB — BASIC METABOLIC PANEL
BUN: 14 mg/dL (ref 6–23)
BUN: 22 mg/dL (ref 6–23)
BUN: 31 mg/dL — ABNORMAL HIGH (ref 6–23)
CO2: 21 mEq/L (ref 19–32)
CO2: 23 mEq/L (ref 19–32)
CO2: 26 mEq/L (ref 19–32)
Chloride: 105 mEq/L (ref 96–112)
Chloride: 106 mEq/L (ref 96–112)
Creatinine, Ser: 1.09 mg/dL (ref 0.4–1.5)
Creatinine, Ser: 1.17 mg/dL (ref 0.4–1.5)
GFR calc non Af Amer: 50 mL/min — ABNORMAL LOW (ref >60)
GFR calc non Af Amer: >60 mL/min (ref >60)
Glucose, Bld: 124 mg/dL — ABNORMAL HIGH (ref 70–99)
Glucose, Bld: 133 mg/dL — ABNORMAL HIGH (ref 70–99)
Glucose, Bld: 172 mg/dL — ABNORMAL HIGH (ref 70–99)
Glucose, Bld: 90 mg/dL (ref 70–99)
Potassium: 4.3 mEq/L (ref 3.5–5.1)
Potassium: 4.8 mEq/L (ref 3.5–5.1)
Potassium: 5.1 mEq/L (ref 3.5–5.1)
Sodium: 138 mEq/L (ref 135–145)
Sodium: 140 mEq/L (ref 135–145)

## 2010-06-24 LAB — GLUCOSE, CAPILLARY
Glucose-Capillary: 107 mg/dL — ABNORMAL HIGH (ref 70–99)
Glucose-Capillary: 109 mg/dL — ABNORMAL HIGH (ref 70–99)
Glucose-Capillary: 112 mg/dL — ABNORMAL HIGH (ref 70–99)
Glucose-Capillary: 113 mg/dL — ABNORMAL HIGH (ref 70–99)
Glucose-Capillary: 123 mg/dL — ABNORMAL HIGH (ref 70–99)
Glucose-Capillary: 126 mg/dL — ABNORMAL HIGH (ref 70–99)
Glucose-Capillary: 127 mg/dL — ABNORMAL HIGH (ref 70–99)
Glucose-Capillary: 129 mg/dL — ABNORMAL HIGH (ref 70–99)
Glucose-Capillary: 131 mg/dL — ABNORMAL HIGH (ref 70–99)
Glucose-Capillary: 132 mg/dL — ABNORMAL HIGH (ref 70–99)
Glucose-Capillary: 135 mg/dL — ABNORMAL HIGH (ref 70–99)
Glucose-Capillary: 153 mg/dL — ABNORMAL HIGH (ref 70–99)
Glucose-Capillary: 163 mg/dL — ABNORMAL HIGH (ref 70–99)
Glucose-Capillary: 171 mg/dL — ABNORMAL HIGH (ref 70–99)
Glucose-Capillary: 82 mg/dL (ref 70–99)
Glucose-Capillary: 85 mg/dL (ref 70–99)
Glucose-Capillary: 92 mg/dL (ref 70–99)
Glucose-Capillary: 94 mg/dL (ref 70–99)
Glucose-Capillary: 94 mg/dL (ref 70–99)

## 2010-06-24 LAB — WOUND CULTURE

## 2010-06-24 LAB — URINALYSIS, ROUTINE W REFLEX MICROSCOPIC
Glucose, UA: NEGATIVE mg/dL
Glucose, UA: NEGATIVE mg/dL
Nitrite: NEGATIVE
Protein, ur: NEGATIVE mg/dL
Protein, ur: NEGATIVE mg/dL
Specific Gravity, Urine: 1.016 (ref 1.005–1.030)
Urobilinogen, UA: 1 mg/dL (ref 0.0–1.0)
pH: 5 (ref 5.0–8.0)

## 2010-06-24 LAB — DIFFERENTIAL
Basophils Absolute: 0 10*3/uL (ref 0.0–0.1)
Basophils Relative: 0 % (ref 0–1)
Basophils Relative: 0 % (ref 0–1)
Eosinophils Absolute: 0.1 10*3/uL (ref 0.0–0.7)
Eosinophils Relative: 1 % (ref 0–5)
Lymphocytes Relative: 6 % — ABNORMAL LOW (ref 12–46)
Lymphs Abs: 0.2 10*3/uL — ABNORMAL LOW (ref 0.7–4.0)
Monocytes Absolute: 0.8 10*3/uL (ref 0.1–1.0)
Monocytes Relative: 5 % (ref 3–12)
Neutro Abs: 2.5 10*3/uL (ref 1.7–7.7)
Neutrophils Relative %: 87 % — ABNORMAL HIGH (ref 43–77)

## 2010-06-24 LAB — BLOOD GAS, ARTERIAL
Acid-base deficit: 6.1 mmol/L — ABNORMAL HIGH (ref 0.0–2.0)
Delivery systems: POSITIVE
O2 Content: 8 L/min
O2 Saturation: 96.2 %
Patient temperature: 98.6
pCO2 arterial: 47.9 mmHg — ABNORMAL HIGH (ref 35.0–45.0)

## 2010-06-24 LAB — COMPREHENSIVE METABOLIC PANEL
BUN: 39 mg/dL — ABNORMAL HIGH (ref 6–23)
Calcium: 8.7 mg/dL (ref 8.4–10.5)
Glucose, Bld: 162 mg/dL — ABNORMAL HIGH (ref 70–99)
Total Protein: 6.5 g/dL (ref 6.0–8.3)

## 2010-06-24 LAB — URINE MICROSCOPIC-ADD ON

## 2010-06-24 LAB — CARDIAC PANEL(CRET KIN+CKTOT+MB+TROPI)
CK, MB: 2 ng/mL (ref 0.3–4.0)
Total CK: 265 U/L — ABNORMAL HIGH (ref 7–232)
Troponin I: 0.01 ng/mL (ref 0.00–0.06)
Troponin I: 0.01 ng/mL (ref 0.00–0.06)

## 2010-06-24 LAB — ANAEROBIC CULTURE

## 2010-06-24 LAB — URINE CULTURE

## 2010-06-27 ENCOUNTER — Other Ambulatory Visit: Payer: Self-pay | Admitting: Internal Medicine

## 2010-07-27 ENCOUNTER — Other Ambulatory Visit: Payer: Self-pay | Admitting: Internal Medicine

## 2010-08-01 NOTE — H&P (Signed)
Jon Hayden, Jon Hayden                ACCOUNT NO.:  [id number]   MEDICAL RECORD NO.:  [id number]          PATIENT TYPE:  INP   LOCATION:  0103                         FACILITY:  [facility]   PHYSICIAN:  Angelia Mould. Derrell Lolling, M.D.DATE OF BIRTH:  [birth year]   DATE OF ADMISSION:  11/10/2008  DATE OF DISCHARGE:                              HISTORY & PHYSICAL   CHIEF COMPLAINT:  Abdominal pain, chills, vomiting.   HISTORY OF PRESENT ILLNESS:  This is a 70 year old Caucasian male who  has a history of diverticulosis in the past and is status post  appendectomy many years ago.   Three days ago on Sunday, he developed lower abdominal pain and lower  abdominal cramps and had some chills.  He endured this and on Monday, 48  hours ago, he was seen in the [facility].  Lab work and urine were done.  He was told he had no blood in  his stool, and his urine was clear, and he was started on trimethoprim  sulfamethoxazole.  Yesterday, he said he felt a little bit better, and  the pain was much better.  Today, he developed more severe malaise and  then at 1:30 p.m. developed intense severe abdominal cramps and hard  rigors.  He had nausea and vomiting a couple of times.  His bowel  movements had been normal.  He has not really been febrile, but he has  felt hot.  Right now, he is feeling a little bit better.   A CT scan was done in the emergency department.  This CT scan is  abnormal in that it shows a 3.5 x 4.5 right cardiophrenic angle mass  which could be metastatic disease versus lymphoma.  There were also  findings compatible with acute inflammatory process of the terminal  ileum with perforation and small amounts of free air near the terminal  ileum, up along the omentum and in the right subphrenic space.  He may  be developing a small-bowel obstruction.  He has significant sigmoid  colon diverticula, but the sigmoid colon does not appear inflamed and  does not, by CT  scan reading, appear to be the primary focus of  inflammation.  There is no deep pelvic process.   He is being admitted and prepared for exploratory laparotomy.   PAST HISTORY:  1. Sleep apnea on CPAP at home.  2. Arthritis.  3. Diabetes mellitus.  4. Diverticulosis with normal colonoscopy 4 months ago by Dr. [name].  5. Hypertension.  6. Hyperlipidemia.  7. Status post open appendectomy.  8. Mild chronic renal insufficiency.   CURRENT MEDICATIONS:  Actos.  Cardia XT.  Fish oil.  Lasix.  Hydroxychloroquine.  Lipitor.  Micardis.  Nexium.  Niacin.  Trimethoprim  sulfamethoxazole.  Vitamin C.  Vitamin D.   DRUG ALLERGIES:  PENICILLIN.   FAMILY HISTORY:  Mother died in 49 tuberculosis.  Father died in  [place] in World War II.  He was a [place].  There is no  family history of colon cancer.  There is no family  history of  inflammatory bowel disease.   SOCIAL HISTORY:  The patient and his wife moved from [place] 2 years ago.  His wife is the Occupational psychologist at [employer].  He has not smoked cigarettes in 25  years.  He denies alcohol.  He is retired.   REVIEW OF SYSTEMS:  A 10-system review of systems is performed and is  noncontributory except as described above.   PHYSICAL EXAMINATION:  GENERAL:  Pleasant, alert, overweight Caucasian  gentleman in mild to moderate distress.  VITAL SIGNS:  Temperature 99.4, pulse 102 and regular, respirations 20,  blood pressure initially 91/61, now 125/57.  HEENT:  Eyes:  Sclerae are clear.  Extraocular movements are intact.  Ears, mouth, throat, nose, lips, tongue and oropharynx without gross  lesions.  NECK:  Supple, nontender.  No mass.  I do not feel any adenopathy.  There is no jugular venous distention.  LUNGS:  Clear to auscultation.  No wheezing or rhonchi.  No chest wall  tenderness.  HEART:  Regular rate and rhythm.  I do not hear any ectopy.  I do not  hear any murmurs.  Heart sounds are distant.  ABDOMEN:  Somewhat  obese.  Bowel sounds are markedly diminished.  He is  very tender with involuntary guarding in the right lower quadrant and  also to a lesser degree in the left lower quadrant.  There is a well-  healed scar in the right lower quadrant.  There is no mass or hernia.  I  do not palpate any enlargement of the liver or spleen.  GENITOURINARY:  Penis, scrotum, testes are normal.  I do not feel any  inguinal mass or hernia.  EXTREMITIES:  Moves all four extremities well without pain or deformity.  NEUROLOGIC:  No gross motor or sensory deficits.   ADMISSION DATA:  A CT scan as described above.  White blood cell count  2900 with a left shift.  Hemoglobin 12.7.  Potassium 4.  Glucose 162.  BUN 39.  Creatinine 2.36.  Lipase 13.  Urinalysis pending.  EKG pending.   ASSESSMENT:  1. Acute abdominal pain, most likely due to inflammatory and      perforated process of the terminal ileum.  Differential diagnosis      includes Crohn's disease, Meckel's diverticulum, foreign body      perforation, carcinoma.  Lymphoma.  It is also possible that      sigmoid diverticulitis is masquerading as a right lower quadrant      process.  He has perforation, spreading peritonitis and impending      sepsis.  He will need surgical intervention tonight.  2. 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  The patient      was informed that this will need to be worked up in the future.  3. Sleep apnea.  4. Diabetes mellitus type 2.  5. History of diverticulosis.  6. Hypertension.  7. Hyperlipidemia.  8. Status post open appendectomy.  9. Chronic renal insufficiency, possibly worsening tonight because of      volume depletion.   PLAN:  1. The patient will be given IV fluid resuscitation, broad-spectrum      antibiotics, and he will be taken to the operating room for      exploratory laparotomy.  2. I have informed the patient that he will probably require a bowel      resection, possible colostomy.  I  have discussed the indications      and details of the  surgery with him.  Differential diagnosis has      been discussed.  The risks and complications have been discussed,      including but not limited to bleeding, infection, wound problems      such as infection or hernia, intra-abdominal abscess, injury to      adjacent organs such as retroperitoneal ureter vascular structures      with major reconstructive surgery, cardiac, pulmonary and      thromboembolic problems.  He      seems to understand these issues well.  At this time, all of his      questions are answered.  He is in full agreement with this plan.  I      will notify Dr. Illene Regulus of his admission to assist with      management of his multiple medical problems.      Angelia Mould. Derrell Lolling, M.D.  Electronically Signed     HMI/MEDQ  D:  11/10/2008  T:  11/10/2008  Job:  [id number]   cc:   Rosalyn Gess. Norins, MD  [address]  [place]  [place] [address]   Hedwig Morton. Juanda Chance, MD  [address]  [place]  [place] [address]

## 2010-08-01 NOTE — Letter (Signed)
December 11, 2006    Lemmie Evens, M.D.  [address]  [place], [place] [address]   RE:  [name], [name]  MRN:  [id number]  /  DOB:  [birth year]   Dear [name]:   Thank you very much for seeing Jon Hayden.  This is a very  delightful gentleman who recently moved to [place] from [place].   The patient has a history of seronegative rheumatoid arthritis.  He had  been followed by Dr. Brantley Stage, [address] [place], [place],  [place], [address].  Telephone:  [phone].  Of note, Dr. Caryn Bee did  recommend to the patient that you see him as a rheumatologist.  I assume  that you have a cross country relationship.   I have laboratory dating from January 2008 where the patient had normal  liver function.  TSH was normal.  C-reactive protein was 0.4.  Cyclic  citrullinated peptide IgG was less than 20.  Basic metabolic chemistries  were unremarkable.  Patient had x-rays of his left knee, which showed no  degenerative narrowing.  No effusion.  No fracture.  Lumbar spine,  likewise, was imaged, which revealed moderate degenerative disk changes  in the lower lumbar spine, otherwise no remarkable findings.  Right knee  was likewise normal.  Hands and wrists revealed the patient to have  erosive changes at the 3rd metacarpophalangeal joint of the left hand  consistent with rheumatoid arthritis.  No other erosive changes were  seen in the hands or wrists.   In close, please find my E chart dictation on this patient, which  document his past medical history and medications.   If I can provide any additional information in regards to this patient,  please do not hesitate to contact me.   As always, I appreciate your assistance in the care and evaluation of my  patient's, and look forward to hearing from you.    Sincerely,      Rosalyn Gess. Norins, MD  Electronically Signed    MEN/MedQ  DD: 12/13/2006  DT: 12/13/2006  Job #: [id number]

## 2010-08-01 NOTE — Assessment & Plan Note (Signed)
[facility]                           PRIMARY CARE OFFICE NOTE   NAME:Jon Hayden, Jon Hayden                         MRN:          [id number]  DATE:12/11/2006                            DOB:          11-22-1940    Jon Hayden is a 70 year old Caucasian gentleman, who presents to  establish for ongoing continuity of care.  He was referred through the  courtesy of Dr. Victorino Dike and Meriam Sprague.   Patient needs to establish for ongoing care for his multiple medical  problems.  He does have some concerns in regards to whether he is  suffering from obstructive sleep apnea, and concerns for his high risk  for cardiovascular disease.   PAST MEDICAL HISTORY:  SURGICAL:  1. Appendectomy in 1952.  2. Open reduction of left mandible after a traumatic fracture in 1962.   MEDICAL ILLNESSES:  1. The patient had the usual childhood diseases.  2. History of sero-negative rheumatoid arthritis with full      rheumatology evaluation on the [facility] by Jon Hayden in      [place], [place].  3. History of heme-positive stool; he had a history of colon polyps.      Last colonoscopy from May 25, 2003, which revealed moderate      diverticulosis, severe diverticulosis in the sigmoid colon and      internal hemorrhoids.  He is due for followup colonoscopy in 2010.  4. Diabetes.  5. Peptic ulcer disease.  6. Hypertension.  7. Hyperlipidemia.  8. Chronic renal insufficiency with a GFR of 55 mL per minute.  9. Nephrolithiasis.  10.Urinary tract infections.   CURRENT MEDICATIONS:  1. Vitamin C 1000 mg daily.  2. Multivitamin daily.  3. Aspirin 81 mg daily.  4. Fish oil 1000 mg daily.  5. Niacin OTC 500 mg b.i.d.  6. Actos 30 mg daily.  7. Lipitor 20 mg daily.  8. Enalapril 20 mg q.h.s.  9. Enalapril/HCTZ 10/25 q.a.m.  10.Hydroxychloroquine 200 mg b.i.d.  11.Nexium 40 mg daily.  12.Glipizide 5 mg b.i.d.  13.Hydrocodone/APAP 5/500 q. 6 p.r.n.   FAMILY HISTORY:   Significant for heart disease, hypertension, and his  mother had tuberculosis.  His sister also had tuberculosis and has had  to have pneumonectomy.  She also has multiple medical problems,  including spinal stenosis.   SOCIAL HISTORY:  Patient was raised in [place].  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  He attained the rank of  [employer], but had to muster out as a major due to his medical  disability.  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   DICTATION ENDED AT THIS POINT.     Rosalyn Gess Norins, MD  Electronically Signed    MEN/MedQ  DD: 12/13/2006  DT: 12/13/2006  Job #: [id number]

## 2010-08-01 NOTE — Op Note (Signed)
Jon Hayden, Jon Hayden                ACCOUNT NO.:  [id number]   MEDICAL RECORD NO.:  [id number]          PATIENT TYPE:  INP   LOCATION:  0103                         FACILITY:  [facility]   PHYSICIAN:  Angelia Mould. Derrell Lolling, M.D.DATE OF BIRTH:  08/27/1940   DATE OF PROCEDURE:  11/10/2008  DATE OF DISCHARGE:                               OPERATIVE REPORT   PREOPERATIVE DIAGNOSIS:  Perforated viscus.   POSTOPERATIVE DIAGNOSIS:  Ruptured sigmoid diverticulitis with diffuse  peritonitis.   OPERATION PERFORMED:  1. Exploratory laparotomy.  2. Sigmoid colectomy with end sigmoid colostomy.   SURGEON:  Angelia Mould. Derrell Lolling, M.D.   ASSISTANT:  Lorne Skeens. Hoxworth, M.D.   OPERATIVE INDICATIONS:  This is a 70 year old gentleman who has 4-day  history of lower abdominal pain and chills.  This has been more in the  right lower quadrant than the left lower quadrant, but he has been  hurting throughout the lower abdomen.  He came to emergency room tonight  for a CT scan which suggested inflammatory process of the terminal ileum  with a little bit of free air around the terminal ileum, some air  bubbles in the omentum and a little bit of free air under the right  hemidiaphragm.  The sigmoid colon had numerous diverticula, but the  radiologist felt that the sigmoid colon itself was not inflamed.  On  exam he has tenderness across the entire lower abdomen.  It bothers him  more in the right lower quadrant.  He was counseled regarding a  differential diagnosis including Meckel's diverticulum, Crohn disease  and sigmoid diverticulitis.  He appeared somewhat pale with leukopenia  and elevated BUN and creatinine and was given aggressive fluid  resuscitation and IV antibiotics and brought to the operating room  emergently.   OPERATIVE FINDINGS:  The patient had sigmoid diverticulitis.  There was  a focal inflammatory mass of the mid sigmoid with an obvious  perforation.  There was diffuse purulent fluid  throughout the subphrenic  spaces of the abdomen and pelvis, but there was no fecal contamination.  Cultures were taken.  This had an odor of E-coli.  We ran the small  bowel from the ileocecal valve all the way back to the ligament of  Treitz and it felt normal.  There did not appear to be any primary  disease process of the small bowel.   OPERATIVE TECHNIQUE:  Following induction of general endotracheal  anesthesia, the patient's abdomen was prepped and draped in sterile  fashion.  A Foley catheter had been previously placed.  The nasogastric  tube was placed in the OR.  After prepping and draping the abdomen, the  patient was identified as correct patient, correct procedure.  Broad-  spectrum antibiotics had already been infused.  A lower midline  laparotomy incision was made extending up to the right of the umbilicus  somewhat.  The fascia was incised in the midline and the abdominal  cavity entered, explored with findings as described above.   After we examined the small bowel carefully, it became quite obvious  that he was going to need a  sigmoid colon resection.  We packed off some  of the small bowel and placed self-retaining retractors.  We carefully  mobilized the sigmoid colon from the distal descending all the way down  to the pelvic brim by dividing its lateral peritoneal attachments, this  very carefully.  The colon was quite inflamed.  Once we had the colon  mobilized up, we created a window in the mesentery about 3 cm proximal  to the palpable inflammatory process and then divided the colon  proximally with GIA stapling device.  The mesentery was taken down using  a LigaSure device and stayed very close to the colon to stay out of the  retroperitoneum.  We then cleaned off the colon about 3 or 4 cm distal  to the inflammatory process and likewise stapled it off with a GIA  stapling device.  The specimen was removed.  The distal segment was  quite long and well above the  peritoneal reflection.  I placed two  sutures of 2-0 Prolene in the distal staple line of the proximal rectal  stump.   We then irrigated the abdomen and pelvis and subphrenic spaces  thoroughly.  I used a total of 9 L of fluid.  We had a little bit of  bleeding from the left retroperitoneum but that stopped spontaneously.  We very carefully irrigated out all the subphrenic spaces, paracolic  gutters, interloop spaces of the small bowel and pelvis numerous times  until the fluid looked pretty clear.   We had to carefully mobilize the proximal colon to make a colostomy.  The colon looked quite inflamed and a little bit dusky, but it was  viable.  We selected a spot on the abdominal wall on the left side just  below the umbilicus.  We cut out a generous button of skin and debrided  all the fat.  We incised the anterior rectus sheath  in a cruciate  fashion and stretched this open as much as possible.  We also incised  the posterior rectus sheath..  To get the colon through the wound, we  had to debride a lot of the appendices epiploica and then we could bring  the colon through the wound 5 or  6 cm above the skin.  The distal colon  looked somewhat dusky and so we chose to resect about 4 or 5 cm.  We did  this.  The remaining colon bled some and looked like it would be viable,  although still it was not bright pink.  We matured the colostomy with  multiple interrupted sutures of 3-0 Vicryl until I could pass my finger  through the colostomy and through the fascia to insure that there was no  obstruction.   We changed our gloves.  We irrigated one more time.  We made sure all  the laparotomy pads out and the count was good.  The midline fascia was  closed with running suture of #1 double-stranded PDS and skin was mostly  packed open with a fine mesh gauze.  Just a few staples were placed  around the umbilicus and at the upper and lower ends of the wounds.  The  patient tolerated the  procedure well and was taken to recovery room in  stable condition.  Estimated blood loss was about 3 mL.  Complications  were none.  Sponge, needle and instrument counts were correct.Angelia Mould. Derrell Lolling, M.D.  Electronically Signed     HMI/MEDQ  D:  11/10/2008  T:  11/11/2008  Job:  [id number]   cc:   Rosalyn Gess. Norins, MD  [address]  [place]  [place] [address]

## 2010-08-01 NOTE — Discharge Summary (Signed)
Jon Hayden, Jon Hayden                ACCOUNT NO.:  [id number]   MEDICAL RECORD NO.:  [id number]          PATIENT TYPE:  INP   LOCATION:  1240                         FACILITY:  [facility]   PHYSICIAN:  Rosalyn Gess. Norins, MD  DATE OF BIRTH:  1940/03/24   DATE OF ADMISSION:  02/15/2008  DATE OF DISCHARGE:  02/16/2008                               DISCHARGE SUMMARY   ADMISSION DIAGNOSIS:  Lower gastrointestinal bleed.   DISCHARGE DIAGNOSIS:  Diverticular bleed.   CONSULTANTS:  None.   HISTORY OF PRESENT ILLNESS:  The patient is a 70 year old gentleman who  presented to [facility] with a history of bright red blood per  rectum.  He reports that the first bleeding started about 5:00 p.m. when  he went to the bathroom.  He described it as bright red.  He arrived at  the emergency department where he had a second bowel movement which was  maroon and darkish in color.  The patient had no orthopnea, dizziness or  falls.  The patient has had similar episodes in the past.  He has been  diagnosed with diverticulosis with a question of diverticulitis in the  past.  He did report that he had a history of polyps.  Last colonoscopy  was approximately four and a half years ago which was noted for severe  sigmoid colon diverticulosis and moderate diverticulosis otherwise.  The  patient also has history of internal hemorrhoids.   Please see the H and P for past medical history, current medications,  social history and admitting exam.   HOSPITAL COURSE:  The patient was admitted to the stepdown unit.  He was  started on Flagyl and Cipro for possible diverticulitis.  His initial  hemoglobin was 13.3 gm which dropped to 12 gm.  He had serial H and H  after that with the next one being 11.9 at 0500 hours this a.m. and then  11.9 at 1550 hours this p.m.  The patient reports that he has been  feeling well.  He has had no abdominal pain or discomfort.  Flagyl and  Cipro were discontinued this morning.   He has remained afebrile with no  abdominal tenderness.  The patient reports that he did have one loose  soft stool that was darkish maroonish in color but nothing like the  bright red blood he has passed earlier.   With the patient's vital signs being stable, his blood pressure being  stable, his hemoglobin being stable x3 at 12-hour intervals and his  abdominal exam revealing no pain tenderness, he is now felt ready for  discharge home.   DISCHARGE PHYSICAL EXAMINATION:  VITAL SIGNS:  The patient is afebrile.  Blood pressure 133/81, pulse 66, respirations 15.  GENERAL APPEARANCE:  This is a heavy-set Caucasian male in no acute  distress.  ABDOMEN: The patient had positive bowel sounds in all four quadrants.  There was no guarding or rebound.  There was no tenderness to deep  palpation.   FINAL LABORATORY:  Chemistry panel from this a.m. with sodium 139,  potassium 4.1, chloride 109, CO2 28, BUN  25, creatinine 1.3, glucose  115, magnesium 2.2 and phosphorus 3.2.  Hemoglobin this morning was 11.9  gm with white count 6300.  Hemoglobin this p.m. was 11.9 gm.   DISPOSITION:  The patient is discharged home.  He was given clear  instructions that if he has any recurrent bleeding that he should notify  the office, and we will either see him in the office or arrange for  hospital evaluation.   The patient will be due for routine colonoscopy in August 2010.   DISCHARGE MEDICATIONS:  The patient is to continue his home medications.  1. Office note from January 22, 2008, at which time the patient's      blood pressure medication was changed, he was to increase his      diltiazem dose to 240 mg once daily, taking two of his 120 mg      tablets.  He reports that his blood pressure has remained unchanged      with systolics in the 140s.  At this point, we will have him resume      taking diltiazem at 120 mg daily.  2. Will increase Micardis to 80 mg daily.  3. The patient will hold aspirin  for an additional five days.  4. He will continue niacin 500 mg b.i.d.  5. Actos 30 mg daily.  6. Furosemide 40 mg daily.  7. Cardia XT 120 mg daily.  8. Hydroxychloroquine sulfate 200 mg 1 tablet by mouth b.i.d.  9. Nexium 40 mg daily.  10.Glucophage 500 mg b.i.d.  11.The patient will not take piroxicam for five days.   CONDITION ON DISCHARGE:  The patient's condition at time of discharge  dictation is stable.   FOLLOWUP:  The patient will be seen in followup on Friday, February 20, 2008, at which time we will repeat H and H and monitor his blood  pressure.      Rosalyn Gess Norins, MD  Electronically Signed     MEN/MEDQ  D:  02/16/2008  T:  02/17/2008  Job:  [id number]

## 2010-08-01 NOTE — Procedures (Signed)
Jon Hayden, WANGERIN NO.:  [id number]   MEDICAL RECORD NO.:  [id number]          PATIENT TYPE:  OUT   LOCATION:  SLEEP CENTER                 [facility]ACILITY:  [facility]   PHYSICIAN:  Barbaraann Share, MD,FCCPDATE OF BIRTH:  03[birth year]1940   DATE OF STUDY:  08/06/2008                            NOCTURNAL POLYSOMNOGRAM   REFERRING PHYSICIAN:  Rosalyn Gess. Norins, MD   INDICATION FOR STUDY:  Hypersomnia with sleep apnea.   EPWORTH SLEEPINESS SCORE:  10.   MEDICATIONS:   SLEEP ARCHITECTURE:  The patient had a total sleep time of 357 minutes  with very little slow-wave sleep and also decreased REM.  Sleep onset  latency was normal at 23 minutes, and REM onset was normal at 91  minutes.  Sleep efficiency was 86% during the diagnostic portion of the  study, and 70% during the titration portion.   RESPIRATORY DATA:  The the patient was ordered to have a standard NPSG;  however, he met emergency split night protocol criteria due to the  severity of his apnea.  He was found to have 174 obstructive events in  the first 150 minutes of sleep.  This gave him an apnea/hypopnea index  of 70 events per hour during the diagnostic portion of the study.  Events were clearly worse during REM, and loud snoring was noted  throughout.  He was subsequently fitted with a medium ResMed Quattro  full face mask, and ultimately titrated to an optimal pressure of 13 cm.  The patient was tried on higher pressures, however, these resulted in  pressure induced central apneas.   OXYGEN DATA:  There was O2 desaturation as low as 69% with his  obstructive events.   CARDIAC DATA:  No clinically significant arrhythmias were noted.   MOVEMENT-PARASOMNIA:  The patient had no leg jerks or abnormal behavior  seen.   IMPRESSIONS-RECOMMENDATIONS:  Severe obstructive sleep apnea/hypopnea  syndrome with an AHI of 70 events per hour and O2 desaturation as low as  69% during the first part of the night.  The patient  then met the  emergency split-night criteria, and was placed on a medium ResMed  Quattro full face mask, and titrated to an optimal pressure of 13 cm of  water.  The patient should also be encouraged to work aggressively on  weight loss.      Barbaraann Share, MD,FCCP  Diplomate, American Board of Sleep  Medicine  Electronically Signed     [facility]/MEDQ  D:  08/09/2008 18:26:51  T:  08/10/2008 06:14:21  Job:  [id number]

## 2010-08-01 NOTE — H&P (Signed)
NAMERANIER, COACH NO.:  [id number]   MEDICAL RECORD NO.:  [id number]          PATIENT TYPE:  INP   LOCATION:  1240                         FACILITY:  [facility]   PHYSICIAN:  Lucita Ferrara, MD         DATE OF BIRTH:  02/14/1941   DATE OF ADMISSION:  02/15/2008  DATE OF DISCHARGE:                              HISTORY & PHYSICAL   PRIMARY CARE PHYSICIAN:  Rosalyn Gess. Norins, M.D. from [facility].   HISTORY OF PRESENT ILLNESS:  The patient is a 70 year old who presents  to [facility] with rectal bleeding.  Rectal bleeding occurred  at 5 p.m. when the patient went to the bathroom.  It was described as  bright red blood per rectum.  He arrived at the emergency room where he  had another bowel movement which was maroon and darkish in color.  There  is no orthopnea and there is no dizziness or falls.  The patient has had  similar episodes in the past and has been diagnosed with  diverticulosis/questionable diverticulitis.  He also tells me he has a  history of polyps.  He gets colonoscopies every 5 years.  Last  colonoscopy was about 3 years ago and colonoscopy at that time showed  severe sigmoid colon diverticulosis, moderate diverticulosis otherwise.  He also has a history of internal hemorrhoids.  His review of systems  otherwise negative.  He did have some crampy diarrhea but he denied any  severe abdominal distention or pain.  He is chronically bloated as he  describes, but did not have any nausea or vomiting.  He denies any  changes in medications.  He is on chronic nonsteroidal anti-  inflammatories.   PAST MEDICAL HISTORY:  The patient's past medical history is significant  for:  1. Seronegative rheumatoid arthritis, on chronic nonsteroidal anti-      inflammatory medications.  2. Diabetes type 2.  3. History of blood documented peptic ulcer disease.  4. Hypertension.  5. Hyperlipidemia.  6. Chronic renal insufficiency with GFR noted to be  55 mL before.  7. History of nephrolithiasis.  8. Urinary tract infection   CURRENT MEDICATIONS:  1. Metformin 500 mg daily.  2. Feldene dose not written here.  3. Plaquenil twice daily.  4. Furosemide once daily.  5. Micardis 21 mg daily.  6. Actos 30 mg daily.   He is also on Lipitor, fish oil, and Nexium.  Those doses are not  finalized.   PAST SURGICAL HISTORY:  1. Status post appendectomy 1952.  2. Status post open reduction of left mandible after traumatic      fracture in 1962.   SOCIAL HISTORY:  The patient was supposedly raised in [place].  He was  also in the [employer].  He is married.  He denies drugs, alcohol or  tobacco.   FAMILY HISTORY:  The patient's family history is significant for  cardiovascular disease and hypertension in mother.  Supposedly also had  tuberculosis.  Sister had TB.   REVIEW OF SYSTEMS:  As per HPI, otherwise negative.  PHYSICAL EXAMINATION:  GENERAL:  The patient is pleasant 70 year old in  no acute distress.  HEENT: The patient is normocephalic, atraumatic.  Sclerae is anicteric.  NECK:  Supple.  No JVD.  No carotid bruits.  CARDIOVASCULAR:  S1 and S2.  Regular rate and rhythm.  No murmurs, rubs  or clicks.  LUNGS:  Clear to auscultation bilaterally.  No rhonchi, rales or  wheezes.  ABDOMEN:  Soft, nontender, nondistended.  Positive bowel sounds.  EXTREMITIES:  No clubbing, cyanosis or edema.  RECTAL:  Guaiac is positive.  There are no hemorrhoids noted.  Stool is  brown.   LABORATORY DATA:  INR is 1.  Basic metabolic panel shows a BUN 33,  creatinine 1, GFR 56.  Urinalysis showed rare bacteria.  CBC shows a  white count 10.1, hemoglobin 13.3, hematocrit of 39.6, platelet count of  204,000.   ASSESSMENT:  The patient is a 70 year old with:  1. Bright red blood per rectum, history of diverticulosis, normal      hemoglobin.  No hemodynamic compromise.  Last colonoscopy dated      2005 showed moderate diverticulosis and severe  sigmoid colon      diverticulosis and internal hemorrhoids.  The patient states that      he has a history of polyps.  He gets colonoscopies every 5 years.      Last colonoscopy was 3 years ago.  2. Diabetes type 2 on oral medications of metformin and Actos.  3. Rheumatoid arthritis on chronic nonsteroidal anti-inflammatories,      Feldene and also on Plaquenil.  4. Peptic ulcer disease.  5. Chronic renal insufficiency was low GFR   DISCUSSION AND PLAN:  I am going to go ahead and admit the patient to  the step-down unit.  Will  monitor his hemoglobin/hematocrit every 6  hours times 24 hours.  Will type and cross and hold packed red blood  cells.  Will transfuse as needed.  Will initiate Protonix 40 mg IV twice  daily.  Will proceed with gastroenterology consultation.  I am going to  go ahead and hold his metformin and start him on sliding scale insulin.  Will need to monitor his renal function and his hemodynamic status.  Given potential for renal insufficiency and worsening renal function,  will hold his ACE inhibitor.  Will also hold his Lasix for now.  DVT  prophylaxis with SCD boots.  GI prophylaxis with Protonix.  The rest of  plans are dependent on his progress and consult recommendations.      Lucita Ferrara, MD  Electronically Signed     RR/MEDQ  D:  02/15/2008  T:  02/16/2008  Job:  [id number]

## 2010-10-25 ENCOUNTER — Other Ambulatory Visit: Payer: Self-pay | Admitting: Internal Medicine

## 2010-11-28 ENCOUNTER — Other Ambulatory Visit: Payer: Self-pay | Admitting: Internal Medicine

## 2010-12-19 LAB — CROSSMATCH: ABO/RH(D): O NEG

## 2010-12-19 LAB — COMPREHENSIVE METABOLIC PANEL
Alkaline Phosphatase: 80 U/L (ref 39–117)
BUN: 25 mg/dL — ABNORMAL HIGH (ref 6–23)
CO2: 28 mEq/L (ref 19–32)
Calcium: 8.6 mg/dL (ref 8.4–10.5)
GFR calc non Af Amer: 55 mL/min — ABNORMAL LOW (ref >60)
Glucose, Bld: 115 mg/dL — ABNORMAL HIGH (ref 70–99)
Total Protein: 5.4 g/dL — ABNORMAL LOW (ref 6.0–8.3)

## 2010-12-19 LAB — URINE MICROSCOPIC-ADD ON

## 2010-12-19 LAB — HEMOGLOBIN A1C: Mean Plasma Glucose: 123 mg/dL

## 2010-12-19 LAB — DIFFERENTIAL
Basophils Relative: 0 % (ref 0–1)
Lymphs Abs: 1.9 10*3/uL (ref 0.7–4.0)
Monocytes Relative: 7 % (ref 3–12)
Neutro Abs: 7.1 10*3/uL (ref 1.7–7.7)
Neutrophils Relative %: 70 % (ref 43–77)

## 2010-12-19 LAB — GLUCOSE, CAPILLARY
Glucose-Capillary: 107 mg/dL — ABNORMAL HIGH (ref 70–99)
Glucose-Capillary: 115 mg/dL — ABNORMAL HIGH (ref 70–99)
Glucose-Capillary: 96 mg/dL (ref 70–99)

## 2010-12-19 LAB — BASIC METABOLIC PANEL
Calcium: 9 mg/dL (ref 8.4–10.5)
Creatinine, Ser: 1.5 mg/dL (ref 0.4–1.5)
GFR calc Af Amer: 56 mL/min — ABNORMAL LOW (ref >60)

## 2010-12-19 LAB — SAMPLE TO BLOOD BANK

## 2010-12-19 LAB — PHOSPHORUS: Phosphorus: 3.2 mg/dL (ref 2.3–4.6)

## 2010-12-19 LAB — CBC
HCT: 34.9 % — ABNORMAL LOW (ref 39.0–52.0)
Hemoglobin: 11.9 g/dL — ABNORMAL LOW (ref 13.0–17.0)
MCHC: 34.2 g/dL (ref 30.0–36.0)
RBC: 3.62 MIL/uL — ABNORMAL LOW (ref 4.22–5.81)
RBC: 4.05 MIL/uL — ABNORMAL LOW (ref 4.22–5.81)
RDW: 13.4 % (ref 11.5–15.5)
WBC: 10.1 10*3/uL (ref 4.0–10.5)

## 2010-12-19 LAB — URINALYSIS, ROUTINE W REFLEX MICROSCOPIC
Nitrite: NEGATIVE
Specific Gravity, Urine: 1.027 (ref 1.005–1.030)
pH: 6.5 (ref 5.0–8.0)

## 2010-12-19 LAB — HEMOGLOBIN AND HEMATOCRIT, BLOOD
HCT: 35.2 % — ABNORMAL LOW (ref 39.0–52.0)
HCT: 35.8 % — ABNORMAL LOW (ref 39.0–52.0)

## 2010-12-19 LAB — MAGNESIUM: Magnesium: 2.2 mg/dL (ref 1.5–2.5)

## 2010-12-19 LAB — PROTIME-INR: INR: 1 (ref 0.00–1.49)

## 2011-01-17 ENCOUNTER — Other Ambulatory Visit: Payer: Self-pay | Admitting: Internal Medicine

## 2011-01-19 ENCOUNTER — Other Ambulatory Visit: Payer: Self-pay | Admitting: Internal Medicine

## 2011-02-16 ENCOUNTER — Other Ambulatory Visit: Payer: Self-pay | Admitting: Internal Medicine

## 2011-04-09 ENCOUNTER — Telehealth: Payer: Self-pay | Admitting: Internal Medicine

## 2011-04-09 ENCOUNTER — Other Ambulatory Visit: Payer: Self-pay | Admitting: *Deleted

## 2011-04-09 NOTE — Telephone Encounter (Signed)
Left a message for patient for patient to call back with patient's wife

## 2011-04-09 NOTE — Telephone Encounter (Signed)
Spoke with patient and he is c/o abdominal tenderness/discomfort below the belly button at his former stoma site. States he has had this off and on for several weeks and it is not going away. He is also having lots of gas and more frequent loose stools. States he is having 2-3 loose stools/day. Denies fever, nausea or vomiting. Hx- diverticulosis, diverticulitis, perforated sigmoid diverticulitis. Scheduled patient on 04/10/11 at 9:30 AM with Mike Gip, PA

## 2011-04-10 ENCOUNTER — Ambulatory Visit (INDEPENDENT_AMBULATORY_CARE_PROVIDER_SITE_OTHER): Payer: BC Managed Care – PPO | Admitting: Physician Assistant

## 2011-04-10 ENCOUNTER — Encounter: Payer: Self-pay | Admitting: Physician Assistant

## 2011-04-10 VITALS — BP 150/68 | HR 72 | Ht 75.0 in

## 2011-04-10 DIAGNOSIS — R143 Flatulence: Secondary | ICD-10-CM

## 2011-04-10 DIAGNOSIS — R1032 Left lower quadrant pain: Secondary | ICD-10-CM

## 2011-04-10 DIAGNOSIS — K5732 Diverticulitis of large intestine without perforation or abscess without bleeding: Secondary | ICD-10-CM

## 2011-04-10 DIAGNOSIS — IMO0001 Reserved for inherently not codable concepts without codable children: Secondary | ICD-10-CM

## 2011-04-10 DIAGNOSIS — K5792 Diverticulitis of intestine, part unspecified, without perforation or abscess without bleeding: Secondary | ICD-10-CM

## 2011-04-10 MED ORDER — ALIGN 4 MG PO CAPS: 1.0000 | ORAL_CAPSULE | Freq: Every day | ORAL | Status: DC

## 2011-04-10 MED ORDER — CIPROFLOXACIN HCL 500 MG PO TABS: 500.0000 mg | ORAL_TABLET | Freq: Two times a day (BID) | ORAL | Status: DC

## 2011-04-10 MED ORDER — METRONIDAZOLE 500 MG PO TABS: 500.0000 mg | ORAL_TABLET | Freq: Two times a day (BID) | ORAL | Status: AC

## 2011-04-10 NOTE — Progress Notes (Signed)
Reviewed and agree with management. Sevannah Madia D. Kerron Sedano, M.D., FACG  

## 2011-04-10 NOTE — Patient Instructions (Signed)
We have given you samples of Align probiotic, take 1 daily until finished.  We sent a prescription for Cipro and Flagyl to [facility], [address].  Call us at any point if you feel your symptoms worsening. [phone]. Make a follow up appointment with Dr. Derrell Lolling in a month.

## 2011-04-10 NOTE — Progress Notes (Signed)
Subjective:    Patient ID: Jon Hayden, male    DOB: 09/24/1940, 71 y.o.   MRN: [id number]  HPI Tywan is a very nice 71 year old male known to Dr. [name] who has history of diverticular disease which was complicated by a colon perforation in August of 2010 he required emergency surgery and diverting colostomy. His colostomy was taken down and he was reanastomosed in December of 2010. He underwent colonoscopy prior to the take down which did show moderate diverticulosis in the sigmoid region and evidence of pan diverticular disease in the remaining colon, he did not have any polyps.  Patient comes in today stating that he has had ongoing problems with very minor seepage from his stoma site which has persisted over the past 2 years. He has been seen by Dr. Derrell Lolling and was last seen in the summer of 2012 and at that point it was suggested that he may need to have takedown of the incision if he seepage persisted. He says he is seepage has been very very mild but he does keep the area covered with gauze, and usually sees a spot or 2 of liquid daily.  Over the past couple of weeks he has developed significant intestinal gas which is been very forceful as well as some abdominal bloating. He says he is not having any belching or burping no nausea or vomiting no fever or chills. He does feel that his stools have increased in frequency and are somewhat looser as well. He has been having some left lower quadrant gastritis particularly after eating. He says he is not having any significant pain more just a gassy or crampy type of discomfort. He has not been on any new medications had any dosages changed nor any antibiotics. He says he is concerned because he did not really have any significant pain when he had his colon perforation either.    Review of Systems  Constitutional: Negative.   HENT: Negative.   Eyes: Negative.   Respiratory: Negative.   Cardiovascular: Negative.   Gastrointestinal: Positive  for abdominal pain and abdominal distention.  Genitourinary: Negative.   Musculoskeletal: Negative.   Neurological: Negative.   Hematological: Negative.   Psychiatric/Behavioral: Negative.    Outpatient Prescriptions Prior to Visit  Medication Sig Dispense Refill  . Ascorbic Acid (VITAMIN C) 1000 MG tablet Take 1,000 mg by mouth daily.        Marland Kitchen aspirin 81 MG tablet Take 81 mg by mouth daily.        Marland Kitchen atorvastatin (LIPITOR) 20 MG tablet TAKE ONE TABLET BY MOUTH ONE TIME DAILY IN THE P.M.  90 tablet  1  . diltiazem (CARTIA XT) 300 MG 24 hr capsule Take 1 capsule (300 mg total) by mouth daily.  90 capsule  3  . esomeprazole (NEXIUM) 40 MG packet Take 40 mg by mouth daily before breakfast.  90 each  3  . fish oil-omega-3 fatty acids 1000 MG capsule Take 1.5 g by mouth daily.       . furosemide (LASIX) 40 MG tablet Take 40 mg by mouth daily.        Marland Kitchen glucose blood (ONE TOUCH TEST STRIPS) test strip 1 each by Other route 2 (two) times daily. Use as instructed       . hydroxychloroquine (PLAQUENIL) 200 MG tablet TAKE ONE TABLET BY MOUTH TWICE DAILY  180 tablet  3  . Inositol Niacinate (NIACIN FLUSH FREE) 500 MG CAPS Take by mouth 2 (two) times daily.        Marland Kitchen  metFORMIN (GLUCOPHAGE) 1000 MG tablet Take 500 mg by mouth 2 (two) times daily with a meal.        . MICARDIS 80 MG tablet TAKE ONE TABLET BY MOUTH ONE TIME DAILY  30 each  6  . MULTIPLE VITAMIN PO Take by mouth.        . pioglitazone (ACTOS) 30 MG tablet Take 30 mg by mouth daily.        . Cholecalciferol (VITAMIN D) 2000 UNITS CAPS Take 1,000 capsules by mouth daily.       . furosemide (LASIX) 40 MG tablet Take 1 tablet (40 mg total) by mouth daily.  30 tablet  11  . metFORMIN (GLUCOPHAGE) 500 MG tablet TAKE ONE TABLET BY MOUTH TWICE DAILY  180 tablet  4  . pioglitazone (ACTOS) 30 MG tablet Take 1 tablet (30 mg total) by mouth daily.  30 tablet  11   Allergies  Allergen Reactions  . Penicillins        Objective:   Physical Exam  well-developed white male in no acute distress, blood pressure 150/68 pulse 72, alert and oriented x3, pleasant. HEENT; nontraumatic, normocephalic, EOMI, PERRLA ,sclera anicteric, Supple no JVD, Cardiovascular; regular rate and rhythm with S1-S2 no murmur rub or gallop, Pulmonary; clear bilaterally, Abdomen; large, soft, he has a midline incisional scar and an ostomy scar which has a pinpoint spot in the center which appears wet, patient has covered with gauze which has a tiny dark spot on it he is not tender directly under the ostomy but he is tender in the left lower quadrant with deep palpation there is no guarding and no rebound sounds are present, Rectal; exam not done, Extremities; no clubbing. cyanosis or edema, Psych; mood and affect normal and appropriate.        Assessment & Plan:  #58 71 year old male with history of perforated sigmoid diverticulitis August 2010 on a requiring temporary colostomy which was taken down with re\re anastomosis December of 2010. Patient has had a persistent very small volume seepage from his ostomy incisional site, question fistula #2 2 week history of crampy lower abdominal pain and increased gas as well as stool frequency. I suspect he has a mild diverticulitis, also consider bacterial overgrowth Plan; we'll treat with a 10 day course of Cipro 500 mg twice daily and Flagyl 500 mg by mouth twice daily Add Align one by mouth daily x3 weeks , Advised patient to make a followup appointment with Dr. Derrell Lolling Advised patient to call here should he have any increase in his abdominal pain or failure to improve with this course of antibiotics, and at that point would obtain CT scan of the abdomen and pelvis

## 2011-04-14 ENCOUNTER — Emergency Department (HOSPITAL_COMMUNITY): Payer: BC Managed Care – PPO

## 2011-04-14 ENCOUNTER — Emergency Department (HOSPITAL_COMMUNITY)
Admission: EM | Admit: 2011-04-14 | Discharge: 2011-04-14 | Disposition: A | Discharge status: Home / Self Care | Payer: BC Managed Care – PPO | Attending: Emergency Medicine | Admitting: Emergency Medicine

## 2011-04-14 ENCOUNTER — Encounter (HOSPITAL_COMMUNITY): Payer: Self-pay | Admitting: Emergency Medicine

## 2011-04-14 DIAGNOSIS — K5730 Diverticulosis of large intestine without perforation or abscess without bleeding: Secondary | ICD-10-CM | POA: Insufficient documentation

## 2011-04-14 DIAGNOSIS — E119 Type 2 diabetes mellitus without complications: Secondary | ICD-10-CM | POA: Insufficient documentation

## 2011-04-14 DIAGNOSIS — X58XXXA Exposure to other specified factors, initial encounter: Secondary | ICD-10-CM | POA: Insufficient documentation

## 2011-04-14 DIAGNOSIS — Z933 Colostomy status: Secondary | ICD-10-CM | POA: Insufficient documentation

## 2011-04-14 DIAGNOSIS — K625 Hemorrhage of anus and rectum: Secondary | ICD-10-CM | POA: Insufficient documentation

## 2011-04-14 DIAGNOSIS — I10 Essential (primary) hypertension: Secondary | ICD-10-CM | POA: Insufficient documentation

## 2011-04-14 DIAGNOSIS — L089 Local infection of the skin and subcutaneous tissue, unspecified: Secondary | ICD-10-CM | POA: Insufficient documentation

## 2011-04-14 HISTORY — DX: Scoliosis, unspecified: M41.9

## 2011-04-14 LAB — COMPREHENSIVE METABOLIC PANEL
ALT: 21 U/L (ref 0–53)
Alkaline Phosphatase: 105 U/L (ref 39–117)
BUN: 32 mg/dL — ABNORMAL HIGH (ref 6–23)
CO2: 25 mEq/L (ref 19–32)
Chloride: 103 mEq/L (ref 96–112)
GFR calc Af Amer: 60 mL/min — ABNORMAL LOW (ref >90)
GFR calc non Af Amer: 52 mL/min — ABNORMAL LOW (ref >90)
Glucose, Bld: 121 mg/dL — ABNORMAL HIGH (ref 70–99)
Potassium: 3.9 mEq/L (ref 3.5–5.1)
Sodium: 138 mEq/L (ref 135–145)
Total Bilirubin: 0.4 mg/dL (ref 0.3–1.2)
Total Protein: 7.1 g/dL (ref 6.0–8.3)

## 2011-04-14 LAB — DIFFERENTIAL
Eosinophils Absolute: 0.2 10*3/uL (ref 0.0–0.7)
Lymphocytes Relative: 20 % (ref 12–46)
Lymphs Abs: 1.4 10*3/uL (ref 0.7–4.0)
Monocytes Relative: 10 % (ref 3–12)
Neutro Abs: 4.9 10*3/uL (ref 1.7–7.7)
Neutrophils Relative %: 67 % (ref 43–77)

## 2011-04-14 LAB — SAMPLE TO BLOOD BANK

## 2011-04-14 LAB — CBC
Hemoglobin: 14.3 g/dL (ref 13.0–17.0)
MCH: 32.4 pg (ref 26.0–34.0)
Platelets: 185 10*3/uL (ref 150–400)
RBC: 4.42 MIL/uL (ref 4.22–5.81)
WBC: 7.3 10*3/uL (ref 4.0–10.5)

## 2011-04-14 LAB — LIPASE, BLOOD: Lipase: 33 U/L (ref 11–59)

## 2011-04-14 MED ORDER — SODIUM CHLORIDE 0.9 % IV BOLUS (SEPSIS)
1000.0000 mL | Freq: Once | INTRAVENOUS | Status: AC
  Administered 2011-04-14: 1000 mL via INTRAVENOUS

## 2011-04-14 MED ORDER — CLINDAMYCIN PHOSPHATE 900 MG/50ML IV SOLN
900.0000 mg | Freq: Once | INTRAVENOUS | Status: AC
  Administered 2011-04-14: 900 mg via INTRAVENOUS
  Filled 2011-04-14: qty 50

## 2011-04-14 MED ORDER — IOHEXOL 300 MG/ML  SOLN
100.0000 mL | Freq: Once | INTRAMUSCULAR | Status: AC | PRN
  Administered 2011-04-14: 100 mL via INTRAVENOUS

## 2011-04-14 MED ORDER — CLINDAMYCIN HCL 150 MG PO CAPS: 300.0000 mg | ORAL_CAPSULE | Freq: Three times a day (TID) | ORAL | Status: AC
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
## 2011-04-14 NOTE — ED Notes (Signed)
Rectal bleeding (spotting) bright red, onset Tuesday, seen per Dr. Juanda Chance and placed on Flagyl and Cipro. Today had dark blood and bright yellow fluid from old ostomy site, take down of colostomy 1.5 yrs ago but has a fistual

## 2011-04-16 ENCOUNTER — Telehealth: Payer: Self-pay | Admitting: Physician Assistant

## 2011-04-16 NOTE — Telephone Encounter (Signed)
Hx perforated sigmoid diverticulitis with temporary colostomy in August 2010. Patient saw Mike Gip, PA on 04/10/11 for cramping, increase gas, rectal bleeding, lower abdominal pain and persistent seepage from ostomy incision site. Was given Cipro and Flagyl to take and advised to see surgeon re: seepage and to call if increase in abd. Pain. On Saturday, patient had rectal bleeding and from his former ostomy site he had bleeding and pus. He went to the ED. He had a CT scan which did not show acute findings. He was given Clindamycin for cutaneous infection. He is calling for continued rectal bleeding which is heavier at times. He is anxious about this and is wanting to know how long he may expect to have bleeding. Please, advise.

## 2011-04-16 NOTE — Telephone Encounter (Signed)
Complicated patient. Fortunately, it sounds like it can wait until the morning when Dr. Juanda Chance returns. Thanks

## 2011-04-17 ENCOUNTER — Other Ambulatory Visit: Payer: Self-pay | Admitting: Internal Medicine

## 2011-04-17 MED ORDER — HYDROCORTISONE ACETATE 25 MG RE SUPP: 25.0000 mg | Freq: Two times a day (BID) | RECTAL | Status: DC

## 2011-04-18 ENCOUNTER — Encounter: Payer: Self-pay | Admitting: *Deleted

## 2011-04-18 ENCOUNTER — Telehealth: Payer: Self-pay | Admitting: *Deleted

## 2011-04-18 NOTE — Telephone Encounter (Signed)
Message copied by Daphine Deutscher on Wed Apr 18, 2011  9:23 AM ------      Message from: Hart Carwin      Created: Tue Apr 17, 2011  6:09 PM      Regarding: Dolan Amen, I spoke with Me Kimberling and I would like to see him at the end of my schedule on Friday. Afternoon.

## 2011-04-18 NOTE — Telephone Encounter (Signed)
See phone note on 04/18/11.

## 2011-04-18 NOTE — Telephone Encounter (Signed)
Spoke with patient and gave him OV on 04/20/11 at 2:45/3:00 PM.

## 2011-04-20 ENCOUNTER — Ambulatory Visit (INDEPENDENT_AMBULATORY_CARE_PROVIDER_SITE_OTHER): Payer: BC Managed Care – PPO | Admitting: Internal Medicine

## 2011-04-20 ENCOUNTER — Encounter: Payer: Self-pay | Admitting: Internal Medicine

## 2011-04-20 VITALS — BP 134/80 | HR 60 | Ht 75.0 in | Wt 270.0 lb

## 2011-04-20 DIAGNOSIS — K625 Hemorrhage of anus and rectum: Secondary | ICD-10-CM

## 2011-04-20 DIAGNOSIS — K648 Other hemorrhoids: Secondary | ICD-10-CM

## 2011-04-20 MED ORDER — HYDROCORTISONE ACETATE 25 MG RE SUPP: 25.0000 mg | Freq: Two times a day (BID) | RECTAL | Status: DC
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
## 2011-04-20 NOTE — Patient Instructions (Addendum)
We will send suppositories to your pharmacy. CC: Dr Debby Bud, Dr Derrell Lolling

## 2011-04-21 ENCOUNTER — Encounter (HOSPITAL_COMMUNITY): Payer: Self-pay | Admitting: Emergency Medicine

## 2011-05-01 ENCOUNTER — Encounter (INDEPENDENT_AMBULATORY_CARE_PROVIDER_SITE_OTHER): Payer: Self-pay | Admitting: General Surgery

## 2011-05-11 ENCOUNTER — Other Ambulatory Visit: Payer: Self-pay | Admitting: Internal Medicine

## 2011-05-11 NOTE — Telephone Encounter (Signed)
Done

## 2011-05-17 ENCOUNTER — Ambulatory Visit (INDEPENDENT_AMBULATORY_CARE_PROVIDER_SITE_OTHER): Payer: BC Managed Care – PPO | Admitting: General Surgery

## 2011-05-17 ENCOUNTER — Encounter (INDEPENDENT_AMBULATORY_CARE_PROVIDER_SITE_OTHER): Payer: Self-pay | Admitting: General Surgery

## 2011-05-17 VITALS — BP 142/80 | HR 70 | Temp 97.4°F | Resp 18 | Ht 75.0 in | Wt 277.0 lb

## 2011-05-17 DIAGNOSIS — K432 Incisional hernia without obstruction or gangrene: Secondary | ICD-10-CM

## 2011-05-17 DIAGNOSIS — T148XXA Other injury of unspecified body region, initial encounter: Secondary | ICD-10-CM

## 2011-05-17 DIAGNOSIS — T07XXXA Unspecified multiple injuries, initial encounter: Secondary | ICD-10-CM

## 2011-05-17 NOTE — Patient Instructions (Signed)
The drainage from your abdominal wound does not look like an intestinal origin. 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  On the CT scan, we also see a small incisional hernia at that site.  You will be scheduled for an injection study of the drainage site to prove that there is no communication with the intestine. You will return to see Dr. Derrell Lolling in a few weeks to discuss the results of this test and decide what next SOR.
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
## 2011-05-21 ENCOUNTER — Ambulatory Visit (HOSPITAL_COMMUNITY)
Admission: RE | Admit: 2011-05-21 | Discharge: 2011-05-21 | Disposition: A | Discharge status: Home / Self Care | Payer: BC Managed Care – PPO | Source: Ambulatory Visit | Attending: General Surgery | Admitting: General Surgery

## 2011-05-21 DIAGNOSIS — T148XXA Other injury of unspecified body region, initial encounter: Secondary | ICD-10-CM

## 2011-05-21 DIAGNOSIS — T8189XA Other complications of procedures, not elsewhere classified, initial encounter: Secondary | ICD-10-CM | POA: Insufficient documentation

## 2011-05-21 DIAGNOSIS — Y833 Surgical operation with formation of external stoma as the cause of abnormal reaction of the patient, or of later complication, without mention of misadventure at the time of the procedure: Secondary | ICD-10-CM | POA: Insufficient documentation

## 2011-05-21 MED ORDER — IOHEXOL 300 MG/ML  SOLN
5.0000 mL | Freq: Once | INTRAMUSCULAR | Status: AC | PRN
  Administered 2011-05-21: 5 mL

## 2011-05-22 ENCOUNTER — Telehealth (INDEPENDENT_AMBULATORY_CARE_PROVIDER_SITE_OTHER): Payer: Self-pay

## 2011-05-22 NOTE — Telephone Encounter (Signed)
Pt notified of dg study. Pt to keep f/u appt and call if area changes.

## 2011-06-16 ENCOUNTER — Other Ambulatory Visit: Payer: Self-pay | Admitting: Internal Medicine

## 2011-06-25 ENCOUNTER — Encounter (INDEPENDENT_AMBULATORY_CARE_PROVIDER_SITE_OTHER): Payer: Self-pay | Admitting: General Surgery

## 2011-06-25 ENCOUNTER — Ambulatory Visit (INDEPENDENT_AMBULATORY_CARE_PROVIDER_SITE_OTHER): Payer: BC Managed Care – PPO | Admitting: General Surgery

## 2011-06-25 VITALS — BP 132/80 | HR 68 | Temp 98.8°F | Resp 16 | Ht 75.5 in | Wt 275.0 lb

## 2011-06-25 DIAGNOSIS — IMO0002 Reserved for concepts with insufficient information to code with codable children: Secondary | ICD-10-CM

## 2011-06-25 DIAGNOSIS — T8189XA Other complications of procedures, not elsewhere classified, initial encounter: Secondary | ICD-10-CM
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
## 2011-06-25 NOTE — Patient Instructions (Signed)
You will be scheduled for surgery to explore the abdominal wound, cut out any infected tissue or sutures.

## 2011-06-29 ENCOUNTER — Telehealth: Payer: Self-pay | Admitting: Internal Medicine

## 2011-06-29 NOTE — Telephone Encounter (Signed)
Please send Anusol HC supp 1 bid, #[phone]. Please schedule to see me next week, add on, I wonder if he can ask Dr Derrell Lolling to band his hemorrhoids.

## 2011-06-29 NOTE — Telephone Encounter (Signed)
Spoke with patient and he reports rectal bleeding from hemorrhoids has been worse this week. He is not using any suppositories at this time. He also wants Dr. Juanda Chance to know he is scheduled for surgery with Dr. Derrell Lolling on 07/29/11 (stoma healing issue). Per last note, patient may need hem banding. Please, advise.

## 2011-07-02 MED ORDER — HYDROCORTISONE ACETATE 25 MG RE SUPP: 25.0000 mg | Freq: Two times a day (BID) | RECTAL | Status: DC

## 2011-07-02 NOTE — Telephone Encounter (Signed)
Rx sent to pharmacy.  Scheduled patient on 07/06/11 at 1:30 PM.Left a message for patient to call

## 2011-07-02 NOTE — Telephone Encounter (Signed)
Spoke with patient and gave him Dr. Regino Schultze recommendations and appointment. He will check with Dr. Derrell Lolling re: banding.

## 2011-07-03 ENCOUNTER — Ambulatory Visit (HOSPITAL_COMMUNITY): Admission: RE | Admit: 2011-07-03 | Payer: BC Managed Care – PPO | Source: Ambulatory Visit | Admitting: General Surgery

## 2011-07-03 ENCOUNTER — Encounter (HOSPITAL_COMMUNITY): Admission: RE | Payer: Self-pay | Source: Ambulatory Visit

## 2011-07-03 SURGERY — DEBRIDEMENT, WOUND, ABDOMEN
Anesthesia: General

## 2011-07-04 ENCOUNTER — Other Ambulatory Visit: Payer: Self-pay | Admitting: Internal Medicine

## 2011-07-05 ENCOUNTER — Telehealth (INDEPENDENT_AMBULATORY_CARE_PROVIDER_SITE_OTHER): Payer: Self-pay | Admitting: General Surgery

## 2011-07-05 NOTE — Telephone Encounter (Signed)
Mr Messmer calling to ask if Dr. Derrell Lolling can band his hems at the same time he is having his surgery.  He sees Dr. Juanda Chance, and she suggested it could be done at the same time.  If you would let him know.  Surgery is scheduled for 07/31/11.

## 2011-07-06 ENCOUNTER — Encounter: Payer: Self-pay | Admitting: Internal Medicine

## 2011-07-06 ENCOUNTER — Ambulatory Visit (INDEPENDENT_AMBULATORY_CARE_PROVIDER_SITE_OTHER): Payer: BC Managed Care – PPO | Admitting: Internal Medicine

## 2011-07-06 ENCOUNTER — Other Ambulatory Visit: Payer: Self-pay | Admitting: Internal Medicine

## 2011-07-06 VITALS — BP 140/70 | HR 78 | Ht 75.5 in | Wt 270.0 lb

## 2011-07-06 DIAGNOSIS — K648 Other hemorrhoids: Secondary | ICD-10-CM

## 2011-07-06 DIAGNOSIS — K625 Hemorrhage of anus and rectum: Secondary | ICD-10-CM
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
## 2011-07-06 NOTE — Patient Instructions (Signed)
Dr Edythe Lynn

## 2011-07-07 ENCOUNTER — Encounter: Payer: Self-pay | Admitting: Internal Medicine

## 2011-07-10 ENCOUNTER — Other Ambulatory Visit (INDEPENDENT_AMBULATORY_CARE_PROVIDER_SITE_OTHER): Payer: Self-pay | Admitting: General Surgery

## 2011-07-10 ENCOUNTER — Telehealth (INDEPENDENT_AMBULATORY_CARE_PROVIDER_SITE_OTHER): Payer: Self-pay

## 2011-07-10 ENCOUNTER — Other Ambulatory Visit: Payer: Self-pay | Admitting: Internal Medicine

## 2011-07-10 NOTE — Telephone Encounter (Signed)
Pt does want Dr Derrell Lolling to inject his hems at time of wound debridement. Pt advised that Dr Derrell Lolling is willing to do this at the time he is doing the wound surgery. I will notify the surgery schedulers and change the face sheet to add procedure. Dr Derrell Lolling will enter orders into Epic. Pt states he has had bloody, foul drainage from wound for 2 days and request antibiotic be called to pharmacy. Per Dr Jacinto Halim order Cipro 500mg  #20 one po bid and Flagyl 500mg  #30 one po q8h called to [facility] [place]. Pt notified.

## 2011-07-16 ENCOUNTER — Other Ambulatory Visit: Payer: Self-pay | Admitting: Internal Medicine

## 2011-07-18 ENCOUNTER — Encounter (HOSPITAL_COMMUNITY): Payer: Self-pay | Admitting: Pharmacy Technician

## 2011-07-24 ENCOUNTER — Encounter (HOSPITAL_COMMUNITY): Payer: Self-pay

## 2011-07-24 ENCOUNTER — Encounter (HOSPITAL_COMMUNITY)
Admission: RE | Admit: 2011-07-24 | Discharge: 2011-07-24 | Disposition: A | Discharge status: Home / Self Care | Payer: BC Managed Care – PPO | Source: Ambulatory Visit | Attending: General Surgery | Admitting: General Surgery

## 2011-07-24 HISTORY — DX: Myoneural disorder, unspecified: G70.9

## 2011-07-24 HISTORY — DX: Cardiac arrhythmia, unspecified: I49.9

## 2011-07-24 LAB — BASIC METABOLIC PANEL
Calcium: 9.3 mg/dL (ref 8.4–10.5)
Chloride: 103 mEq/L (ref 96–112)
Creatinine, Ser: 1.28 mg/dL (ref 0.50–1.35)
GFR calc Af Amer: 63 mL/min — ABNORMAL LOW (ref >90)

## 2011-07-24 LAB — CBC
MCV: 95.4 fL (ref 78.0–100.0)
Platelets: 190 10*3/uL (ref 150–400)
RDW: 14.2 % (ref 11.5–15.5)
WBC: 8.7 10*3/uL (ref 4.0–10.5)

## 2011-07-24 NOTE — Patient Instructions (Signed)
20 DARRAGH NAY  07/24/2011   Your procedure is scheduled on:  07/31/11   Report to [facility] at  AM.  Call this number if you have problems the morning of surgery: [phone]   Remember:   Do not eat food:After Midnight.  May have clear liquids:until Midnight .  Clear liquids include soda, tea, black coffee, apple or grape juice, broth.  Take these medicines the morning of surgery with A SIP OF WATER:    Do not wear jewelry,   Do not wear lotions, powders, or perfumes.    Do not bring valuables to the hospital.  Contacts, dentures or bridgework may not be worn into surgery.      Patients discharged the day of surgery will not be allowed to drive home.  Name and phone number of your driver:   Special Instructions: CHG Shower Use Special Wash: 1/2 bottle night before surgery and 1/2 bottle morning of surgery. shower chin to toes with CHG.  Wash face and private parts with regular soap.    Please read over the following fact sheets that you were given: MRSA Information, coughing and deep breathing exercises, leg exercises

## 2011-07-24 NOTE — Pre-Procedure Instructions (Signed)
07/24/11 pt reports rash under left breast and one quarter size area on left upper abdomen.  Instructed pt to report to Dr Derrell Lolling.  Patient  voiced understanding.

## 2011-07-27 ENCOUNTER — Encounter (INDEPENDENT_AMBULATORY_CARE_PROVIDER_SITE_OTHER): Payer: BC Managed Care – PPO | Admitting: General Surgery

## 2011-07-30 NOTE — Anesthesia Preprocedure Evaluation (Signed)
Anesthesia Evaluation  Patient identified by MRN, date of birth, ID band Patient awake    Reviewed: Allergy & Precautions, H&P , NPO status , Patient's Chart, lab work & pertinent test results  Airway Mallampati: II TM Distance: >3 FB Neck ROM: Full    Dental No notable dental hx.    Pulmonary sleep apnea , former smoker breath sounds clear to auscultation  Pulmonary exam normal       Cardiovascular hypertension, Rhythm:Regular Rate:Normal     Neuro/Psych negative neurological ROS  negative psych ROS   GI/Hepatic Neg liver ROS, GERD-  Medicated,  Endo/Other  negative endocrine ROSDiabetes mellitus-, Type 2  Renal/GU negative Renal ROS  negative genitourinary   Musculoskeletal  (+) Arthritis -, Rheumatoid disorders,    Abdominal   Peds negative pediatric ROS (+)  Hematology negative hematology ROS (+)   Anesthesia Other Findings   Reproductive/Obstetrics negative OB ROS                           Anesthesia Physical Anesthesia Plan  ASA: III  Anesthesia Plan: General   Post-op Pain Management:    Induction: Intravenous  Airway Management Planned: Oral ETT  Additional Equipment:   Intra-op Plan:   Post-operative Plan: Extubation in OR  Informed Consent: I have reviewed the patients History and Physical, chart, labs and discussed the procedure including the risks, benefits and alternatives for the proposed anesthesia with the patient or authorized representative who has indicated his/her understanding and acceptance.   Dental advisory given  Plan Discussed with: CRNA  Anesthesia Plan Comments:         Anesthesia Quick Evaluation
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
## 2011-07-31 ENCOUNTER — Encounter (HOSPITAL_COMMUNITY): Payer: Self-pay | Admitting: Anesthesiology

## 2011-07-31 ENCOUNTER — Ambulatory Visit (HOSPITAL_COMMUNITY)
Admission: RE | Admit: 2011-07-31 | Discharge: 2011-07-31 | Disposition: A | Discharge status: Home / Self Care | Payer: BC Managed Care – PPO | Source: Ambulatory Visit | Attending: General Surgery | Admitting: General Surgery

## 2011-07-31 ENCOUNTER — Encounter (HOSPITAL_COMMUNITY)
Admission: RE | Disposition: A | Discharge status: Home / Self Care | Payer: Self-pay | Source: Ambulatory Visit | Attending: General Surgery

## 2011-07-31 ENCOUNTER — Ambulatory Visit (HOSPITAL_COMMUNITY): Payer: BC Managed Care – PPO | Admitting: Anesthesiology

## 2011-07-31 ENCOUNTER — Encounter (HOSPITAL_COMMUNITY): Payer: Self-pay | Admitting: *Deleted

## 2011-07-31 DIAGNOSIS — Z7982 Long term (current) use of aspirin: Secondary | ICD-10-CM | POA: Insufficient documentation

## 2011-07-31 DIAGNOSIS — K648 Other hemorrhoids: Secondary | ICD-10-CM

## 2011-07-31 DIAGNOSIS — G4730 Sleep apnea, unspecified: Secondary | ICD-10-CM | POA: Insufficient documentation

## 2011-07-31 DIAGNOSIS — T8189XA Other complications of procedures, not elsewhere classified, initial encounter: Secondary | ICD-10-CM

## 2011-07-31 DIAGNOSIS — Z79899 Other long term (current) drug therapy: Secondary | ICD-10-CM | POA: Insufficient documentation

## 2011-07-31 DIAGNOSIS — Z01812 Encounter for preprocedural laboratory examination: Secondary | ICD-10-CM | POA: Insufficient documentation

## 2011-07-31 DIAGNOSIS — E669 Obesity, unspecified: Secondary | ICD-10-CM | POA: Insufficient documentation

## 2011-07-31 DIAGNOSIS — E119 Type 2 diabetes mellitus without complications: Secondary | ICD-10-CM | POA: Insufficient documentation

## 2011-07-31 DIAGNOSIS — I10 Essential (primary) hypertension: Secondary | ICD-10-CM | POA: Insufficient documentation

## 2011-07-31 DIAGNOSIS — Z01810 Encounter for preprocedural cardiovascular examination: Secondary | ICD-10-CM | POA: Insufficient documentation

## 2011-07-31 DIAGNOSIS — K219 Gastro-esophageal reflux disease without esophagitis: Secondary | ICD-10-CM | POA: Insufficient documentation

## 2011-07-31 DIAGNOSIS — M069 Rheumatoid arthritis, unspecified: Secondary | ICD-10-CM | POA: Insufficient documentation

## 2011-07-31 DIAGNOSIS — E785 Hyperlipidemia, unspecified: Secondary | ICD-10-CM | POA: Insufficient documentation

## 2011-07-31 DIAGNOSIS — IMO0002 Reserved for concepts with insufficient information to code with codable children: Secondary | ICD-10-CM | POA: Insufficient documentation

## 2011-07-31 DIAGNOSIS — Y838 Other surgical procedures as the cause of abnormal reaction of the patient, or of later complication, without mention of misadventure at the time of the procedure: Secondary | ICD-10-CM | POA: Insufficient documentation

## 2011-07-31 HISTORY — PX: WOUND DEBRIDEMENT: SHX247

## 2011-07-31 HISTORY — PX: HEMORRHOID SURGERY: SHX153

## 2011-07-31 LAB — GLUCOSE, CAPILLARY: Glucose-Capillary: 128 mg/dL — ABNORMAL HIGH (ref 70–99)

## 2011-07-31 SURGERY — DEBRIDEMENT, WOUND, ABDOMEN
Anesthesia: General | Site: Rectum | Wound class: Contaminated

## 2011-07-31 MED ORDER — ACETAMINOPHEN 10 MG/ML IV SOLN
INTRAVENOUS | Status: AC
  Filled 2011-07-31: qty 100

## 2011-07-31 MED ORDER — HEPARIN SODIUM (PORCINE) 5000 UNIT/ML IJ SOLN
INTRAMUSCULAR | Status: AC
  Administered 2011-07-31: 5000 [IU] via SUBCUTANEOUS
  Filled 2011-07-31: qty 1

## 2011-07-31 MED ORDER — SODIUM CHLORIDE 0.9 % IV SOLN
1500.0000 mg | Freq: Once | INTRAVENOUS | Status: AC
  Administered 2011-07-31: 1500 mg via INTRAVENOUS
  Filled 2011-07-31: qty 1500

## 2011-07-31 MED ORDER — ALMOND OIL (SWEET) OIL
10.0000 mL | TOPICAL_OIL | Status: DC
  Filled 2011-07-31: qty 10

## 2011-07-31 MED ORDER — MIDAZOLAM HCL 5 MG/5ML IJ SOLN
INTRAMUSCULAR | Status: DC | PRN
  Administered 2011-07-31: 2 mg via INTRAVENOUS

## 2011-07-31 MED ORDER — BUPIVACAINE-EPINEPHRINE 0.25% -1:200000 IJ SOLN
INTRAMUSCULAR | Status: AC
  Filled 2011-07-31: qty 1

## 2011-07-31 MED ORDER — DOXYCYCLINE HYCLATE 100 MG PO TABS: 100.0000 mg | ORAL_TABLET | Freq: Two times a day (BID) | ORAL | Status: AC

## 2011-07-31 MED ORDER — HEPARIN SODIUM (PORCINE) 5000 UNIT/ML IJ SOLN: 5000.0000 [IU] | Freq: Once | INTRAMUSCULAR | Status: DC

## 2011-07-31 MED ORDER — PROMETHAZINE HCL 25 MG/ML IJ SOLN: 6.2500 mg | INTRAMUSCULAR | Status: DC | PRN

## 2011-07-31 MED ORDER — FENTANYL CITRATE 0.05 MG/ML IJ SOLN
INTRAMUSCULAR | Status: DC | PRN
  Administered 2011-07-31: 100 ug via INTRAVENOUS
  Administered 2011-07-31: 50 ug via INTRAVENOUS

## 2011-07-31 MED ORDER — CHLORHEXIDINE GLUCONATE 4 % EX LIQD: 1.0000 "application " | Freq: Once | CUTANEOUS | Status: DC

## 2011-07-31 MED ORDER — ONDANSETRON HCL 4 MG/2ML IJ SOLN
INTRAMUSCULAR | Status: DC | PRN
  Administered 2011-07-31: 4 mg via INTRAVENOUS

## 2011-07-31 MED ORDER — ACETAMINOPHEN 10 MG/ML IV SOLN
INTRAVENOUS | Status: DC | PRN
  Administered 2011-07-31: 1000 mg via INTRAVENOUS

## 2011-07-31 MED ORDER — HYALURONIDASE OVINE 200 UNIT/ML IJ SOLN
INTRAMUSCULAR | Status: AC
  Filled 2011-07-31: qty 1.2

## 2011-07-31 MED ORDER — LACTATED RINGERS IV SOLN
INTRAVENOUS | Status: DC | PRN
  Administered 2011-07-31: 07:00:00 via INTRAVENOUS

## 2011-07-31 MED ORDER — MENTHOL PHENOL ALMOND OIL
TOPICAL | Status: DC | PRN
  Administered 2011-07-31: 1 via TOPICAL

## 2011-07-31 MED ORDER — KETOROLAC TROMETHAMINE 30 MG/ML IJ SOLN: 15.0000 mg | Freq: Once | INTRAMUSCULAR | Status: DC | PRN

## 2011-07-31 MED ORDER — VANCOMYCIN HCL IN DEXTROSE 1-5 GM/200ML-% IV SOLN: 1000.0000 mg | INTRAVENOUS | Status: DC

## 2011-07-31 MED ORDER — SUCCINYLCHOLINE CHLORIDE 20 MG/ML IJ SOLN
INTRAMUSCULAR | Status: DC | PRN
  Administered 2011-07-31: 100 mg via INTRAVENOUS

## 2011-07-31 MED ORDER — BUPIVACAINE LIPOSOME 1.3 % IJ SUSP
20.0000 mL | INTRAMUSCULAR | Status: DC
  Filled 2011-07-31 (×3): qty 20

## 2011-07-31 MED ORDER — PROPOFOL 10 MG/ML IV BOLUS
INTRAVENOUS | Status: DC | PRN
  Administered 2011-07-31: 200 mg via INTRAVENOUS

## 2011-07-31 MED ORDER — HYDROMORPHONE HCL PF 1 MG/ML IJ SOLN: 0.2500 mg | INTRAMUSCULAR | Status: DC | PRN

## 2011-07-31 MED ORDER — HYDROCODONE-ACETAMINOPHEN 5-325 MG PO TABS: 1.0000 | ORAL_TABLET | ORAL | Status: AC | PRN

## 2011-07-31 MED ORDER — LACTATED RINGERS IV SOLN: INTRAVENOUS | Status: DC

## 2011-07-31 SURGICAL SUPPLY — 37 items
BLADE HEX COATED 2.75 (ELECTRODE) ×3 IMPLANT
BLADE SURG 15 STRL LF DISP TIS (BLADE) IMPLANT
BLADE SURG 15 STRL SS (BLADE)
BRIEF STRETCH FOR OB PAD LRG (UNDERPADS AND DIAPERS) ×3 IMPLANT
CANISTER SUCTION 2500CC (MISCELLANEOUS) ×3 IMPLANT
CLOTH BEACON ORANGE TIMEOUT ST (SAFETY) ×6 IMPLANT
COVER MAYO STAND STRL (DRAPES) ×3 IMPLANT
DECANTER SPIKE VIAL GLASS SM (MISCELLANEOUS) ×3 IMPLANT
DRAPE LG THREE QUARTER DISP (DRAPES) IMPLANT
DRSG PAD ABDOMINAL 8X10 ST (GAUZE/BANDAGES/DRESSINGS) IMPLANT
ELECT REM PT RETURN 9FT ADLT (ELECTROSURGICAL) ×3
ELECTRODE REM PT RTRN 9FT ADLT (ELECTROSURGICAL) ×2 IMPLANT
GAUZE PACKING IODOFORM 1/4X5 (PACKING) ×3 IMPLANT
GAUZE SPONGE 4X4 16PLY XRAY LF (GAUZE/BANDAGES/DRESSINGS) ×3 IMPLANT
GLOVE BIOGEL PI IND STRL 7.0 (GLOVE) ×4 IMPLANT
GLOVE BIOGEL PI INDICATOR 7.0 (GLOVE) ×2
GLOVE EUDERMIC 7 POWDERFREE (GLOVE) ×6 IMPLANT
GOWN STRL NON-REIN LRG LVL3 (GOWN DISPOSABLE) ×9 IMPLANT
GOWN STRL REIN XL XLG (GOWN DISPOSABLE) ×6 IMPLANT
HEMOSTAT SNOW SURGICEL 2X4 (HEMOSTASIS) IMPLANT
KIT BASIN OR (CUSTOM PROCEDURE TRAY) ×3 IMPLANT
LUBRICANT JELLY K Y 4OZ (MISCELLANEOUS) ×6 IMPLANT
NDL SAFETY ECLIPSE 18X1.5 (NEEDLE) ×4 IMPLANT
NEEDLE HYPO 18GX1.5 SHARP (NEEDLE) ×2
NEEDLE HYPO 25X1 1.5 SAFETY (NEEDLE) ×3 IMPLANT
NS IRRIG 1000ML POUR BTL (IV SOLUTION) ×3 IMPLANT
PACK LITHOTOMY IV (CUSTOM PROCEDURE TRAY) ×3 IMPLANT
PENCIL BUTTON HOLSTER BLD 10FT (ELECTRODE) ×3 IMPLANT
SPONGE GAUZE 4X4 12PLY (GAUZE/BANDAGES/DRESSINGS) ×6 IMPLANT
SPONGE SURGIFOAM ABS GEL 100 (HEMOSTASIS) IMPLANT
SUT CHROMIC 2 0 SH (SUTURE) IMPLANT
SUT CHROMIC 3 0 SH 27 (SUTURE) IMPLANT
SUT VIC AB 2-0 SH 18 (SUTURE) ×3 IMPLANT
SYR CONTROL 10ML LL (SYRINGE) ×6 IMPLANT
TAPE CLOTH SURG 4X10 WHT LF (GAUZE/BANDAGES/DRESSINGS) ×3 IMPLANT
TOWEL OR 17X26 10 PK STRL BLUE (TOWEL DISPOSABLE) ×3 IMPLANT
YANKAUER SUCT BULB TIP 10FT TU (MISCELLANEOUS) ×3 IMPLANT

## 2011-07-31 NOTE — Op Note (Signed)
Patient Name:           Jon Hayden   Date of Surgery:        07/31/2011  Pre op Diagnosis:      Suture granuloma of abdominal wall, bleeding internal hemorrhoids  Post op Diagnosis:    same  Procedure:         Debridement of skin subcutaneous tissue and muscle fascia the abdominal wall, removal of suture material, inject sclerosing solution internal hemorrhoids right anterior, right posterior, left lateral          Surgeon:                     Angelia Mould. Derrell Lolling, M.D., FACS  Assistant:                      none  Operative Indications:   Jon Hayden is a 71 y.o. male Past history is significant for a 2 stage resection for perforated diverticulitis. He is doing well from a GI standpoint. He has developed a chronic draining sinus in his left lower quadrant that will not heal. CT scan suggests an early hernia in that location. Fistulogram reveals only a shallow blind ending sinus tract. There is no communication with the intestine.  He is frustrated that it continues to drain and is inclined to do something proactively. He is concerned that it will get infected in the future. It is suspected that this is a suture granuloma. He also has problems with painless rectal bleeding from internal hemorrhoids and would like to have this injected. He is brought to the operating room electively.   Operative Findings:       I found a suture granuloma deep in the bowel wall of the left lower quadrant where his old colostomy site was. There was a little bit of purulence there and a Novofil suture which clearly was at the center of the sinus tract. I was able to remove the suture entirely. I did not find any other sutures or other abnormalities. He had internal hemorrhoids right anterior, right posterior, and left lateral. All 3 of these areas were injected with sclerosing solution.  Procedure in Detail:          Following the induction of general endotracheal anesthesia the patient was placed in the modified  lithotomy position in rigid stirrups. The abdomen was prepped and draped in a sterile fashion. Intravenous antibiotics were given. Surgical time out was performed. I observed the chronic draining sinus in the left lower quadrant of the abdominal wall. I probed with the sinus with a  hemostat. Using electrocautery I excised a 2 cm circular button of skin and then using a knife and electrocautery slowly debrided down through subcutaneous tissue to the fascia. I continued to dissect the fascia away from the fistula tract and then  found a large Novafil suture. This was lifted up until I could see the loop of the suture and then I cut one side of that so that I could remove the entire suture. Cultures were taken. I opened up the fascia a little bit. There was no more drainage and the wound looked clean at this point. I packed it with iodoform gauze and we dressed this area.  The patient was then placed with his legs up in stirrups and the perirectal area was prepped and draped in sterile fashion. Another time out was performed. Observation revealed no significant perianal disease externally. Digital rectal exam  reveals soft internal hemorrhoidal masses but no polyps or nodules. There was no stenosis. Anoscopy revealed large internal hemorrhoids right anterior and small internal hemorrhoids right posterior and left lateral. All 3 columns were injected generously with sclerosing solution being careful to keep the sclerosing injectate high on the hemorrhoidal pile. There was no bleeding. This was observed for 5 minutes. There was no bleeding.   External bandage and fishnet panties were placed.  The patient tolerated his procedure well and was taken to recovery in stable condition. EBL 15 cc. Counts correct. Complications none.     Angelia Mould. Derrell Lolling, M.D., FACS General and Minimally Invasive Surgery Breast and Colorectal Surgery  07/31/2011 9:01 AM

## 2011-07-31 NOTE — Transfer of Care (Signed)
Immediate Anesthesia Transfer of Care Note  Patient: Jon Hayden  Procedure(s) Performed: Procedure(s) (LRB): DEBRIDEMENT ABDOMINAL WOUND (N/A) HEMORRHOIDECTOMY (N/A)  Patient Location: PACU  Anesthesia Type: General  Level of Consciousness: awake, alert , oriented and patient cooperative  Airway & Oxygen Therapy: Patient Spontanous Breathing and Patient connected to face mask oxygen  Post-op Assessment: Report given to PACU RN and Post -op Vital signs reviewed and stable  Post vital signs: Reviewed and stable  Complications: No apparent anesthesia complications

## 2011-07-31 NOTE — Interval H&P Note (Signed)
History and Physical Interval Note:  07/31/2011 7:28 AM  Jon Hayden  has presented today for surgery, with the diagnosis of NON-HEALING ABDOMINAL WOUND and BLEEDING INTERNAL HEMORRHOIDS.  The goals of treatment and the various methods of treatment have been discussed with the patient and family. After consideration of risks, benefits and other options for treatment, the patient has consented to  Procedure(s) (LRB): DEBRIDEMENT ABDOMINAL WOUND (N/A) HEMORRHOID SCLEROTHERAPY (N/A) as a surgical intervention .  The patients' history has been reviewed and the patient examined today, no change in status, stable for surgery.  I have reviewed the patients' chart and labs.  Questions were answered to the patient's satisfaction.     Ernestene Mention

## 2011-07-31 NOTE — Anesthesia Postprocedure Evaluation (Signed)
  Anesthesia Post-op Note  Patient: Jon Hayden  Procedure(s) Performed: Procedure(s) (LRB): DEBRIDEMENT ABDOMINAL WOUND (N/A) HEMORRHOIDECTOMY (N/A)  Patient Location: PACU  Anesthesia Type: General  Level of Consciousness: awake and alert   Airway and Oxygen Therapy: Patient Spontanous Breathing  Post-op Pain: mild  Post-op Assessment: Post-op Vital signs reviewed, Patient's Cardiovascular Status Stable, Respiratory Function Stable, Patent Airway and No signs of Nausea or vomiting  Post-op Vital Signs: stable  Complications: No apparent anesthesia complications

## 2011-07-31 NOTE — Discharge Instructions (Signed)
Saline and fine mesh gauze wet-to-dry dressings to abdominal wall wound twice a day.  You may shower.  You may drive your car.  Drink lots of fluids and follow a high-fiber, low-fat diet.  He may take a laxative such as MiraLAX if necessary to avoid constipation.  Take the antibiotics for 7 days as prescribed.  Call to set up an appointment with Dr. Derrell Lolling in 3 weeks.

## 2011-07-31 NOTE — Preoperative (Signed)
Beta Blockers   Reason not to administer Beta Blockers:Not Applicable 

## 2011-07-31 NOTE — Progress Notes (Signed)
Teach back discharge teaching completed

## 2011-08-01 ENCOUNTER — Encounter (HOSPITAL_COMMUNITY): Payer: Self-pay | Admitting: General Surgery

## 2011-08-03 LAB — WOUND CULTURE: Gram Stain: NONE SEEN

## 2011-08-05 LAB — ANAEROBIC CULTURE

## 2011-08-10 ENCOUNTER — Encounter (INDEPENDENT_AMBULATORY_CARE_PROVIDER_SITE_OTHER): Payer: BC Managed Care – PPO | Admitting: General Surgery

## 2011-08-13 ENCOUNTER — Other Ambulatory Visit: Payer: Self-pay | Admitting: Internal Medicine

## 2011-08-21 ENCOUNTER — Other Ambulatory Visit: Payer: Self-pay | Admitting: Internal Medicine

## 2011-08-22 ENCOUNTER — Telehealth: Payer: Self-pay | Admitting: Internal Medicine

## 2011-08-22 DIAGNOSIS — E119 Type 2 diabetes mellitus without complications: Secondary | ICD-10-CM

## 2011-08-22 DIAGNOSIS — E785 Hyperlipidemia, unspecified: Secondary | ICD-10-CM

## 2011-08-22 NOTE — Telephone Encounter (Signed)
Pt ? If he needs to come in for a yearly exam. He has been seen by several other MD's in the past 2 mths.

## 2011-08-22 NOTE — Telephone Encounter (Signed)
Although I will miss not seeing him there is no real need for a physical exam., He does need to have an A1C, lipid panel - orders placed. Come see me if he needs me.

## 2011-08-27 ENCOUNTER — Ambulatory Visit (INDEPENDENT_AMBULATORY_CARE_PROVIDER_SITE_OTHER): Payer: BC Managed Care – PPO | Admitting: General Surgery

## 2011-08-27 ENCOUNTER — Telehealth: Payer: Self-pay | Admitting: Internal Medicine

## 2011-08-27 ENCOUNTER — Encounter (INDEPENDENT_AMBULATORY_CARE_PROVIDER_SITE_OTHER): Payer: Self-pay | Admitting: General Surgery

## 2011-08-27 VITALS — BP 140/84 | HR 73 | Temp 97.4°F | Resp 14 | Ht 75.0 in | Wt 275.0 lb

## 2011-08-27 DIAGNOSIS — Z9889 Other specified postprocedural states: Secondary | ICD-10-CM

## 2011-08-27 NOTE — Telephone Encounter (Signed)
Caller: Jon Hayden/Patient; Phone Number: [phone]; Message from caller: Pt stated that he called last week to find out from Dr Norin's nurse when he should be having a physcial again, he stated that he has not heard anything about this from the office yet

## 2011-08-27 NOTE — Patient Instructions (Signed)
Your abdominal wound is healing very rapidly. I suspect that you will not need a bandage much longer.  The recurrence of your rectal bleeding suggests that you may need further intervention and even hemorrhoid banding. We are going to try to avoid a formal hemorrhoidectomy if possible. I would like to put this off until the inflammation from the injection therapy goes down. Because of your trip overseas we plan to see you back in early August 2013.  You have been given a prescription for some rectal cream which can be inserted inside twice a day if you have bleeding problems.

## 2011-08-27 NOTE — Telephone Encounter (Signed)
Responded to call June 5th - see phone note. I returned it to sender Jon Hayden (?) - evidently they did not get my return message or call the patient.  1. Please call patient with my response - he does not need a physical. He should have lab follow-up with OV if needed.  2. Jon Hayden - Clarify if we always reroute calls from [facility] and if so to whom.

## 2011-08-27 NOTE — Progress Notes (Signed)
Subjective:     Patient ID: Jon Hayden, male   DOB: 04-04-1940, 71 y.o.   MRN: [id number]  HPI This gentleman underwent debridement of his left lower quadrant abdominal wound and removal of sutures as well as injection of internal hemorrhoids with sclerosing solution. Date of surgery Jul 31, 2010.  The abdominal wound is healing rapidly and is almost completely healed. Doesn't drain any more. He states that he had transient relief of bleeding but now he is started to have some painless rectal bleeding mostly scant blood on the toilet tissue. No real prolapse or pain. Review of Systems     Objective:   Physical Exam Patient looks well. In no distress.  Abdomen left lower quadrant wound has only a small superficial eschar about 8 mm in diameter. No inflammation. No drainage. Dry wound.  I did not do a rectal exam today.    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 Healing  rapidly following debridement and removal of sutures  Bleeding internal hemorrhoids, status post injection therapy. Now with very low volume painless recurrent bleeding    Plan:     Wound care of abdominal wall discussed. They should be very simple from here on out.  He is going to take an overseas vacation and return in mid July. He will return to see me after that for consideration of anoscopy and hemorrhoidal banding, if he remains symptomatic.  He agrees that he would like to avoid formal hemorrhoidectomy if possible.   Angelia Mould. Derrell Lolling, M.D., [facility] General and Minimally invasive Surgery Breast and Colorectal Surgery Office:   [phone] Pager:   [phone]

## 2011-08-28 NOTE — Telephone Encounter (Signed)
Patient notified of no need to have OV with Dr. Debby Bud but lab work was ordered for him to have. Patient states will come in the AM for lab work.

## 2011-08-29 ENCOUNTER — Other Ambulatory Visit (INDEPENDENT_AMBULATORY_CARE_PROVIDER_SITE_OTHER): Payer: BC Managed Care – PPO

## 2011-08-29 DIAGNOSIS — E119 Type 2 diabetes mellitus without complications: Secondary | ICD-10-CM

## 2011-08-29 DIAGNOSIS — E785 Hyperlipidemia, unspecified: Secondary | ICD-10-CM

## 2011-08-29 LAB — HEPATIC FUNCTION PANEL
AST: 17 U/L (ref 0–37)
Albumin: 4.4 g/dL (ref 3.5–5.2)
Total Bilirubin: 0.5 mg/dL (ref 0.3–1.2)

## 2011-08-29 LAB — LIPID PANEL
HDL: 44.2 mg/dL (ref >39.00)
LDL Cholesterol: 65 mg/dL (ref 0–99)
Total CHOL/HDL Ratio: 3
VLDL: 11 mg/dL (ref 0.0–40.0)

## 2011-08-29 LAB — HEMOGLOBIN A1C: Hgb A1c MFr Bld: 6.1 % (ref 4.6–6.5)

## 2011-08-30 ENCOUNTER — Encounter (INDEPENDENT_AMBULATORY_CARE_PROVIDER_SITE_OTHER): Payer: BC Managed Care – PPO | Admitting: General Surgery

## 2011-08-31 ENCOUNTER — Ambulatory Visit (INDEPENDENT_AMBULATORY_CARE_PROVIDER_SITE_OTHER): Payer: BC Managed Care – PPO | Admitting: Internal Medicine

## 2011-08-31 ENCOUNTER — Telehealth: Payer: Self-pay | Admitting: Internal Medicine

## 2011-08-31 ENCOUNTER — Encounter: Payer: Self-pay | Admitting: Internal Medicine

## 2011-08-31 ENCOUNTER — Ambulatory Visit (INDEPENDENT_AMBULATORY_CARE_PROVIDER_SITE_OTHER)
Admission: RE | Admit: 2011-08-31 | Discharge: 2011-08-31 | Disposition: A | Discharge status: Home / Self Care | Payer: BC Managed Care – PPO | Source: Ambulatory Visit | Attending: Internal Medicine | Admitting: Internal Medicine

## 2011-08-31 VITALS — BP 122/68 | HR 74 | Temp 97.0°F | Ht 75.0 in | Wt 275.0 lb

## 2011-08-31 DIAGNOSIS — I10 Essential (primary) hypertension: Secondary | ICD-10-CM

## 2011-08-31 DIAGNOSIS — R079 Chest pain, unspecified: Secondary | ICD-10-CM

## 2011-08-31 MED ORDER — TELMISARTAN 80 MG PO TABS: 80.0000 mg | ORAL_TABLET | Freq: Every day | ORAL | Status: DC

## 2011-08-31 MED ORDER — PIOGLITAZONE HCL 30 MG PO TABS: 30.0000 mg | ORAL_TABLET | Freq: Every day | ORAL | Status: DC

## 2011-08-31 MED ORDER — DILTIAZEM HCL ER COATED BEADS 300 MG PO CP24: 300.0000 mg | ORAL_CAPSULE | Freq: Every day | ORAL | Status: DC

## 2011-08-31 MED ORDER — ATORVASTATIN CALCIUM 20 MG PO TABS: 20.0000 mg | ORAL_TABLET | Freq: Every day | ORAL | Status: DC

## 2011-08-31 MED ORDER — ESOMEPRAZOLE MAGNESIUM 40 MG PO CPDR: 40.0000 mg | DELAYED_RELEASE_CAPSULE | Freq: Every day | ORAL | Status: DC

## 2011-08-31 MED ORDER — FUROSEMIDE 40 MG PO TABS: 40.0000 mg | ORAL_TABLET | Freq: Every day | ORAL | Status: DC

## 2011-08-31 MED ORDER — METFORMIN HCL 500 MG PO TABS: 500.0000 mg | ORAL_TABLET | Freq: Two times a day (BID) | ORAL | Status: DC

## 2011-08-31 MED ORDER — PIROXICAM 20 MG PO CAPS: 20.0000 mg | ORAL_CAPSULE | ORAL | Status: DC | PRN

## 2011-08-31 MED ORDER — HYDROXYCHLOROQUINE SULFATE 200 MG PO TABS: 200.0000 mg | ORAL_TABLET | Freq: Two times a day (BID) | ORAL | Status: DC

## 2011-08-31 MED ORDER — INOSITOL NIACINATE 500 MG PO CAPS: 500.0000 mg | ORAL_CAPSULE | Freq: Two times a day (BID) | ORAL | Status: DC

## 2011-08-31 NOTE — Telephone Encounter (Signed)
Caller: Jon Hayden/Patient; PCP: Illene Regulus; CB#: [phone];  Call regarding Chest Pain/Chest Discomfort THE PATIENT REFUSED 911;Stabbing chest pain, lips tingling onset 0100 this AM. Pain is constant though the pain changes in intensity. Advised 911 now per CP Protocol. Pt declines. He also declines [facility] ED. RN spoke to [place] in the ofc. Dr. Debby Bud is not in. Dr. [name] will see pt now. Pt is advised and is agreeable.

## 2011-08-31 NOTE — Assessment & Plan Note (Addendum)
ECG reviewed as per emr, pain is pleuritic, most likely MSK strain, but cant r/o pulmonary etiology- also for cxr today;  Very unlikely based on hx and exam to be cardiac, GI , PE or other

## 2011-08-31 NOTE — Patient Instructions (Addendum)
Continue all other medications as before All of your refills were done today as requested Please go to XRAY in the Basement for the x-ray test You will be contacted by phone if any changes need to be made immediately.  Otherwise, you will receive a letter about your results with an explanation. Please see Dr Debby Bud as you have planned - I would recommend in the next 2-4 weeks Please also see your opthomologist on a yearly basis since you are on the plaquinil
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
## 2011-09-01 ENCOUNTER — Encounter: Payer: Self-pay | Admitting: Internal Medicine

## 2011-09-01 NOTE — Assessment & Plan Note (Signed)
stable overall by hx and exam, most recent data reviewed with pt, and pt to continue medical treatment as before BP Readings from Last 3 Encounters:  08/31/11 122/68  08/27/11 140/84  07/31/11 133/71

## 2011-09-02 ENCOUNTER — Encounter: Payer: Self-pay | Admitting: Internal Medicine

## 2011-09-18 ENCOUNTER — Other Ambulatory Visit: Payer: Self-pay | Admitting: Internal Medicine

## 2011-10-14 ENCOUNTER — Other Ambulatory Visit: Payer: Self-pay | Admitting: Internal Medicine

## 2011-10-18 ENCOUNTER — Encounter (INDEPENDENT_AMBULATORY_CARE_PROVIDER_SITE_OTHER): Payer: BC Managed Care – PPO | Admitting: General Surgery

## 2011-11-17 ENCOUNTER — Other Ambulatory Visit: Payer: Self-pay | Admitting: Internal Medicine

## 2011-12-03 ENCOUNTER — Encounter: Payer: Self-pay | Admitting: Internal Medicine

## 2012-02-18 ENCOUNTER — Ambulatory Visit (INDEPENDENT_AMBULATORY_CARE_PROVIDER_SITE_OTHER): Payer: BC Managed Care – PPO | Admitting: Internal Medicine

## 2012-02-18 ENCOUNTER — Encounter: Payer: Self-pay | Admitting: Internal Medicine

## 2012-02-18 VITALS — BP 142/72 | HR 60 | Temp 97.9°F | Resp 12 | Wt 275.0 lb

## 2012-02-18 DIAGNOSIS — H47012 Ischemic optic neuropathy, left eye: Secondary | ICD-10-CM

## 2012-02-18 DIAGNOSIS — C44319 Basal cell carcinoma of skin of other parts of face: Secondary | ICD-10-CM

## 2012-02-18 DIAGNOSIS — H47019 Ischemic optic neuropathy, unspecified eye: Secondary | ICD-10-CM

## 2012-02-18 NOTE — Progress Notes (Signed)
Subjective:    Patient ID: Jon Hayden, male    DOB: 1940/10/04, 71 y.o.   MRN: [id number]  HPI Mr. Rabb presents for evaluation of a changing mole on the left cheek. He has noted a change over the past several weeks. No pain, no trauma.  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  PMH, FamHx and SocHx reviewed for any changes and relevance. Current Outpatient Prescriptions on File Prior to Visit  Medication Sig Dispense Refill  . Ascorbic Acid (VITAMIN C) 1000 MG tablet Take 1,000 mg by mouth daily.       Marland Kitchen aspirin 81 MG tablet Take 81 mg by mouth daily.       Marland Kitchen atorvastatin (LIPITOR) 20 MG tablet TAKE ONE TABLET BY MOUTH ONE TIME DAILY IN THE P.M.  90 tablet  3  . cholecalciferol (VITAMIN D) 1000 UNITS tablet Take 1,000 Units by mouth daily.      Marland Kitchen diltiazem (CARDIZEM CD) 300 MG 24 hr capsule Take 1 capsule (300 mg total) by mouth daily.  90 capsule  3  . esomeprazole (NEXIUM) 40 MG capsule Take 1 capsule (40 mg total) by mouth daily before breakfast.  90 capsule  3  . fish oil-omega-3 fatty acids 1000 MG capsule Take 1 g by mouth daily. 1200mg       . furosemide (LASIX) 40 MG tablet Take 1 tablet (40 mg total) by mouth daily.  90 tablet  3  . hydroxychloroquine (PLAQUENIL) 200 MG tablet Take 1 tablet (200 mg total) by mouth 2 (two) times daily.  180 tablet  3  . Inositol Niacinate (NIACIN FLUSH FREE) 500 MG CAPS Take 1 capsule (500 mg total) by mouth 2 (two) times daily.  180 each  3  . metFORMIN (GLUCOPHAGE) 500 MG tablet Take 1 tablet (500 mg total) by mouth 2 (two) times daily with a meal.  180 tablet  3  . MICARDIS 80 MG tablet TAKE ONE TABLET BY MOUTH ONE TIME DAILY  30 each  5  . MICARDIS 80 MG tablet TAKE ONE TABLET BY MOUTH ONE TIME DAILY  30 each  5  . Multiple Vitamin (MULITIVITAMIN WITH MINERALS) TABS Take 1 tablet by mouth daily.      . pioglitazone (ACTOS) 30 MG tablet Take 1 tablet (30 mg total) by mouth daily.  90  tablet  3  . piroxicam (FELDENE) 20 MG capsule Take 1 capsule (20 mg total) by mouth as needed. For joint pain  90 capsule  3  . [DISCONTINUED] atorvastatin (LIPITOR) 20 MG tablet Take 1 tablet (20 mg total) by mouth daily.  90 tablet  3  . telmisartan (MICARDIS) 80 MG tablet Take 1 tablet (80 mg total) by mouth daily.  90 tablet  3      Review of Systems System review is negative for any constitutional, cardiac, pulmonary, GI or neuro symptoms or complaints other than as described in the HPI.     Objective:   Physical Exam Filed Vitals:   02/18/12 0916  BP: 142/72  Pulse: 60  Temp: 97.9 F (36.6 C)  Resp: 12   Derm - raised flesh colored lesion left cheek 1 cm in diameter with irregular border, visible vessels across the top of the lesion       Assessment & Plan:  1.Basal cell carcinoma - face.  Appearance is classic for basal cell  Plan  Referral to skin surgery center  2.  NAION - reviewed information on OSA and NAION - there is a link  Plan Patient provided with information.

## 2012-02-18 NOTE — Patient Instructions (Signed)
1. Basal cell carcinoma left cheek: for referral to Dermatologic surgery center  2. OSA and NAION - provided quick article off google and referred to PubMed. gov.

## 2012-03-19 IMAGING — CT CT ABD-PELV W/ CM
2 of 5 series · 16 of 46 positions shown, 18 images · IV contrast (omnipaque)
Comparison: No CT abdomen 11/10/2008

CLINICAL DATA: Rectal bleeding, purulent material with prior
colostomy site.

CT ABDOMEN AND PELVIS WITH CONTRAST
TECHNIQUE: Multidetector CT imaging of the abdomen and pelvis was
performed following the standard protocol during bolus
administration of intravenous contrast.
Contrast: 100mL OMNIPAQUE IOHEXOL 300 MG/ML IV SOLN

[Series 2: abd/pel with · axial · 0.81mm/px · z∈[-530,-140]mm · 13 of 88 slices shown, 15 images]
[im 5/88  soft-tissue]
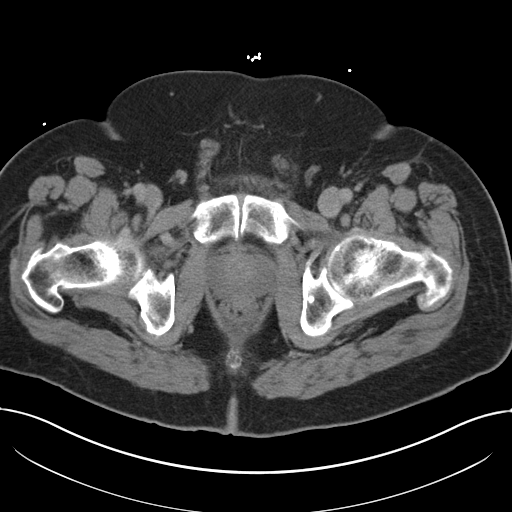
[im 5/88  bone]
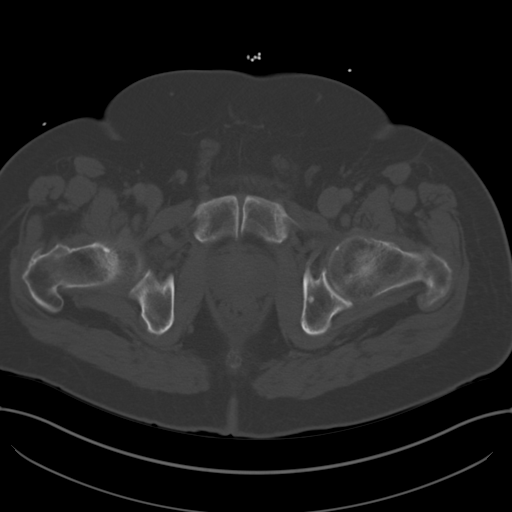
[im 14/88  soft-tissue]
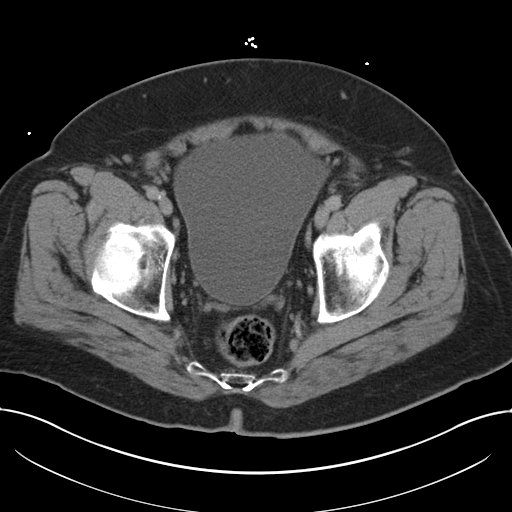
[im 19/88  soft-tissue]
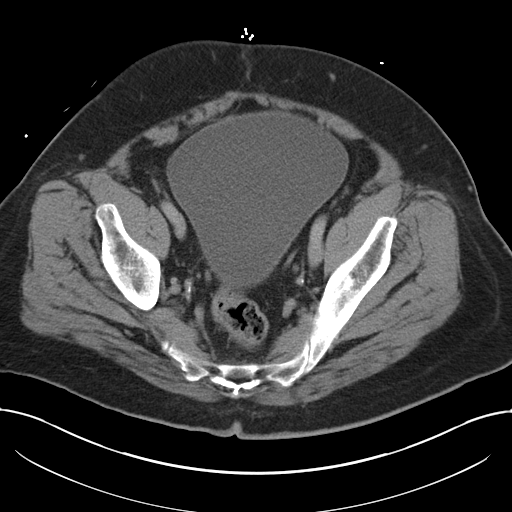
[im 23/88  soft-tissue]
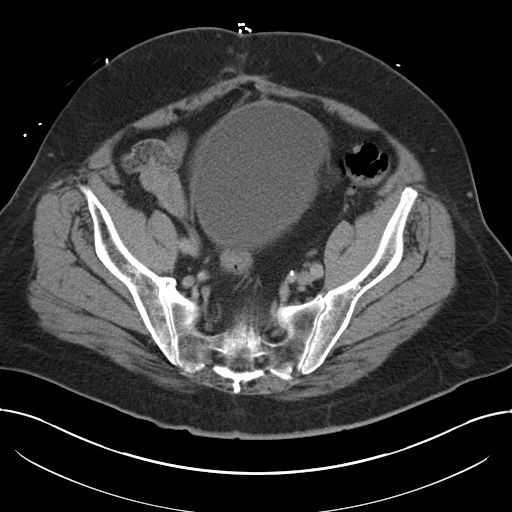
[im 33/88  soft-tissue]
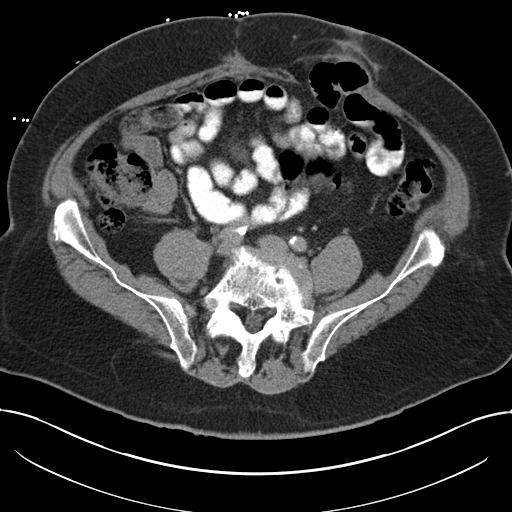
[im 37/88  soft-tissue]
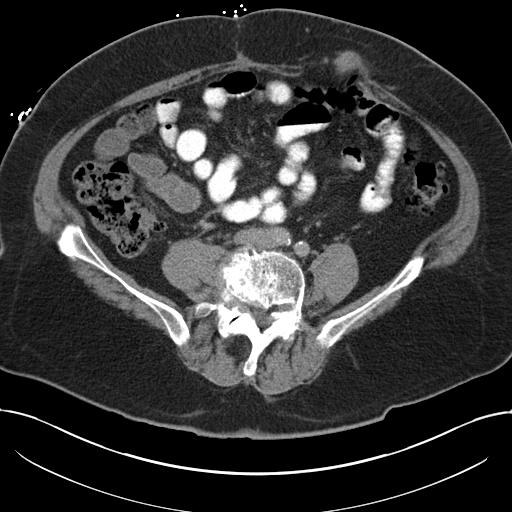
[im 46/88  soft-tissue]
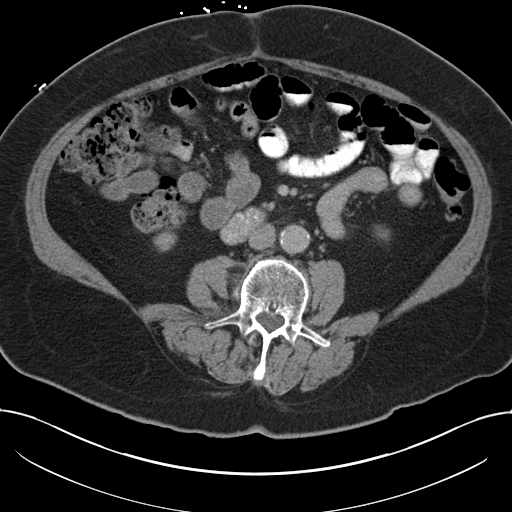
[im 51/88  soft-tissue]
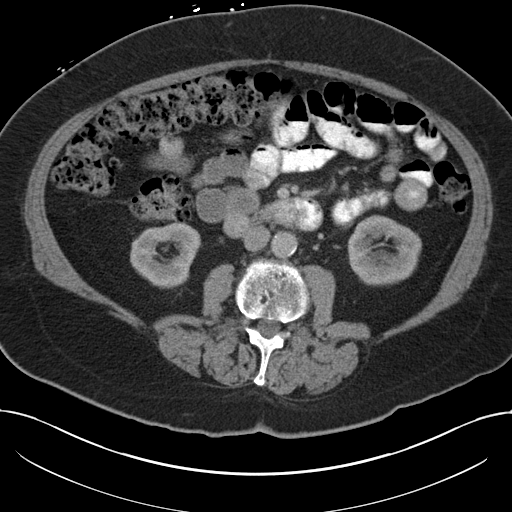
[im 55/88  soft-tissue]
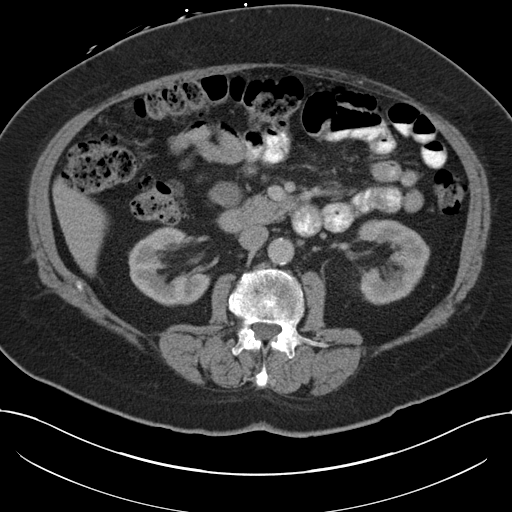
[im 55/88  bone]
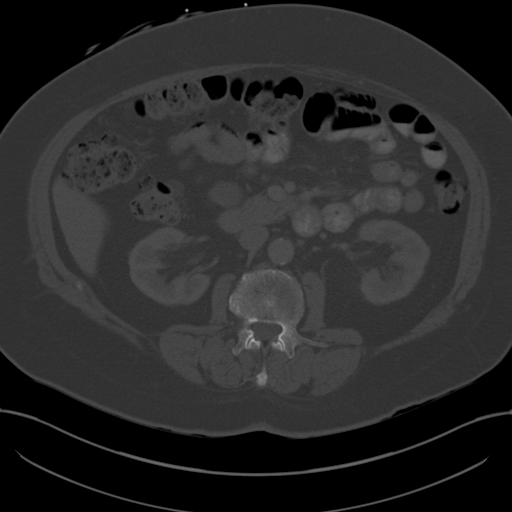
[im 65/88  soft-tissue]
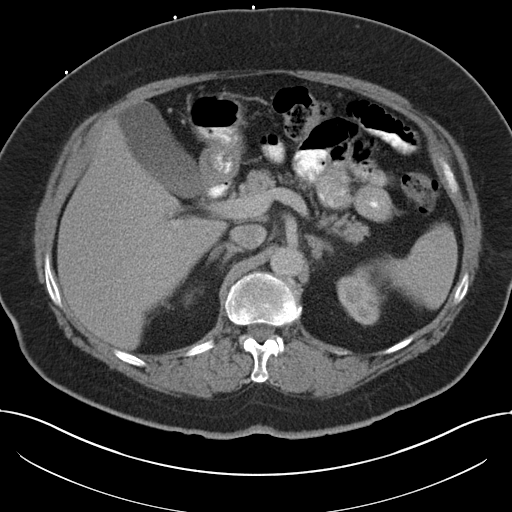
[im 69/88  soft-tissue]
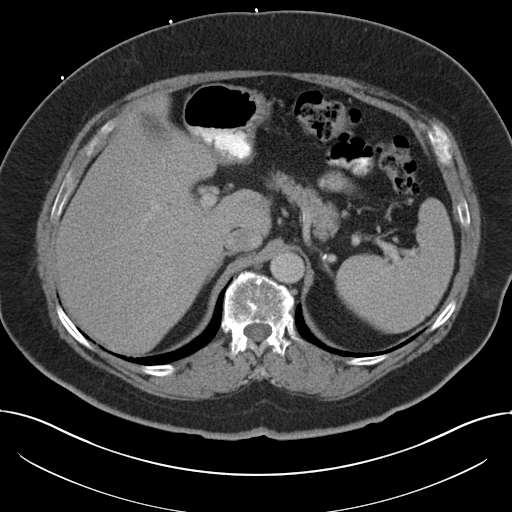
[im 74/88  soft-tissue]
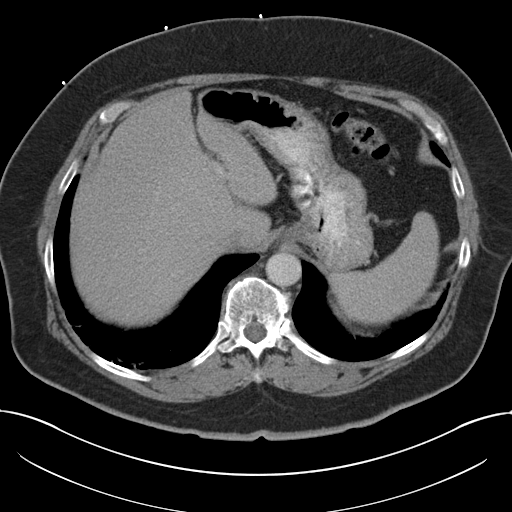
[im 83/88  soft-tissue]
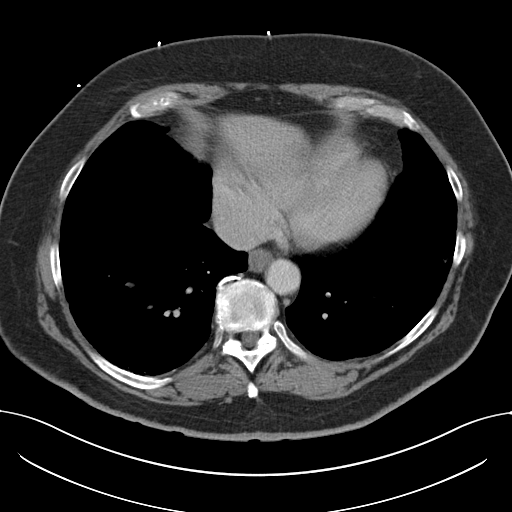

[Series 6: coronal · coronal · 0.79mm/px · 3 of 71 slices shown]
[im 24/71  soft-tissue]
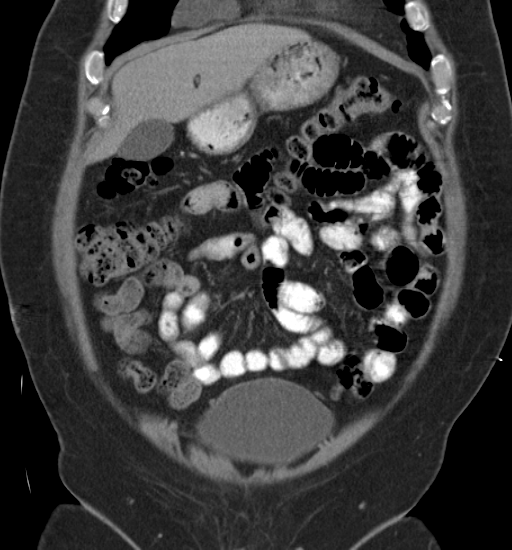
[im 32/71  soft-tissue]
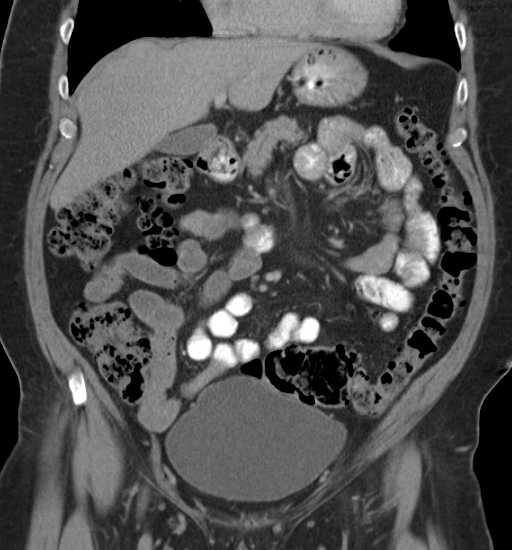
[im 39/71  soft-tissue]
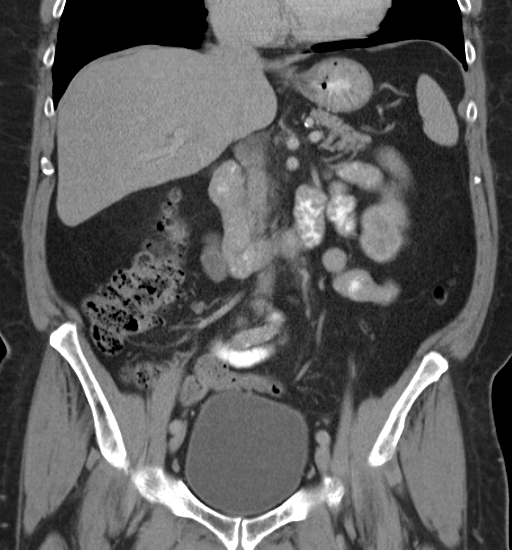

[16 of 46 positions shown; findings below may reference images not displayed]

FINDINGS: Paraseptal emphysema at the lung bases is again
demonstrated.  No pericardial fluid.  There is a pericardial cyst
adjacent to the right atrium (image #1) which is unchanged.

No focal hepatic lesion.  The gallbladder, pancreas, spleen,
adrenal glands, and kidneys are normal.

The stomach is normal.  Small hiatal hernia.  The duodenum,  small
bowel, and colon are normal.  Multiple diverticula through the
colon without acute inflammation.  No evidence of obstruction.

Within the left anterior bowel wall there is a laxity at site of
prior colostomy.  A loop of small bowel does enter the orifice of
the prior stoma.  No evidence obstruction.  There is a track which
extends from  skin surface from the level of the loop of small
bowel over 2.5 of subcutaneous tissue (image 55). There is no clear
evidence of communication with the small bowel.

Abdominal aorta normal caliber.  No retroperitoneal
lymphadenopathy.

No free fluid the pelvis.  The bladder is distended.  The prostate
gland is normal.  No pelvic lymphadenopathy. Review of  bone
windows demonstrates no aggressive osseous lesions.
IMPRESSION: 1..  No clear explanation for rectal bleeding.
2.  There is a cutaneous tract which extends from the skin surface
to the level of the abandoned colostomy stoma.  There is no clear
communication with the bowel however a  loop of small bowel does
enter the orifice of the prior stoma.
3.  Diverticulosis without evidence of diverticulitis.

## 2012-04-16 ENCOUNTER — Encounter: Payer: BC Managed Care – PPO | Admitting: Internal Medicine

## 2012-05-12 ENCOUNTER — Ambulatory Visit (INDEPENDENT_AMBULATORY_CARE_PROVIDER_SITE_OTHER): Payer: BC Managed Care – PPO | Admitting: Internal Medicine

## 2012-05-12 ENCOUNTER — Encounter: Payer: Self-pay | Admitting: Internal Medicine

## 2012-05-12 VITALS — BP 130/64 | HR 67 | Temp 97.5°F | Resp 16 | Ht 75.0 in | Wt 275.0 lb

## 2012-05-12 DIAGNOSIS — G4733 Obstructive sleep apnea (adult) (pediatric): Secondary | ICD-10-CM

## 2012-05-12 DIAGNOSIS — E785 Hyperlipidemia, unspecified: Secondary | ICD-10-CM

## 2012-05-12 DIAGNOSIS — N189 Chronic kidney disease, unspecified: Secondary | ICD-10-CM

## 2012-05-12 DIAGNOSIS — R0989 Other specified symptoms and signs involving the circulatory and respiratory systems: Secondary | ICD-10-CM

## 2012-05-12 DIAGNOSIS — M069 Rheumatoid arthritis, unspecified: Secondary | ICD-10-CM

## 2012-05-12 DIAGNOSIS — K219 Gastro-esophageal reflux disease without esophagitis: Secondary | ICD-10-CM

## 2012-05-12 DIAGNOSIS — E119 Type 2 diabetes mellitus without complications: Secondary | ICD-10-CM

## 2012-05-12 DIAGNOSIS — I10 Essential (primary) hypertension: Secondary | ICD-10-CM

## 2012-05-12 DIAGNOSIS — E663 Overweight: Secondary | ICD-10-CM

## 2012-05-12 NOTE — Patient Instructions (Addendum)
Thanks for coming in.  Will set up a nuclear stress test for risk stratification with many risk factors and increased dyspnea on exertion.  Will set up a consultation with Dr. Shelle Iron to revisit your sleep apnea and any need for re-titration or change in appliance  Routine labs for today.  Please work on diet management: smart food choice, PORITON SIZE CONTROL, regular exercise. Goal is to loose 1-2  Lbs per month long term.  Diabetes -for routine lab today.   Please sign up for MyChart
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
## 2012-05-13 ENCOUNTER — Other Ambulatory Visit (INDEPENDENT_AMBULATORY_CARE_PROVIDER_SITE_OTHER): Payer: BC Managed Care – PPO

## 2012-05-13 DIAGNOSIS — E119 Type 2 diabetes mellitus without complications: Secondary | ICD-10-CM

## 2012-05-13 DIAGNOSIS — K219 Gastro-esophageal reflux disease without esophagitis: Secondary | ICD-10-CM

## 2012-05-13 DIAGNOSIS — I10 Essential (primary) hypertension: Secondary | ICD-10-CM

## 2012-05-13 DIAGNOSIS — E785 Hyperlipidemia, unspecified: Secondary | ICD-10-CM

## 2012-05-13 LAB — COMPREHENSIVE METABOLIC PANEL
Alkaline Phosphatase: 88 U/L (ref 39–117)
Creatinine, Ser: 1.4 mg/dL (ref 0.4–1.5)
GFR: 53.31 mL/min — ABNORMAL LOW (ref >60.00)
Glucose, Bld: 117 mg/dL — ABNORMAL HIGH (ref 70–99)
Sodium: 142 mEq/L (ref 135–145)
Total Bilirubin: 0.6 mg/dL (ref 0.3–1.2)
Total Protein: 6.8 g/dL (ref 6.0–8.3)

## 2012-05-13 LAB — LIPID PANEL
HDL: 42.6 mg/dL (ref >39.00)
Triglycerides: 51 mg/dL (ref 0.0–149.0)
VLDL: 10.2 mg/dL (ref 0.0–40.0)

## 2012-05-13 LAB — HEPATIC FUNCTION PANEL
AST: 20 U/L (ref 0–37)
Albumin: 4.3 g/dL (ref 3.5–5.2)
Alkaline Phosphatase: 88 U/L (ref 39–117)
Bilirubin, Direct: 0.1 mg/dL (ref 0.0–0.3)
Total Bilirubin: 0.6 mg/dL (ref 0.3–1.2)

## 2012-05-13 LAB — CBC WITH DIFFERENTIAL/PLATELET
Basophils Absolute: 0 10*3/uL (ref 0.0–0.1)
Basophils Relative: 0.5 % (ref 0.0–3.0)
Hemoglobin: 14 g/dL (ref 13.0–17.0)
Lymphocytes Relative: 21.5 % (ref 12.0–46.0)
Monocytes Relative: 7.7 % (ref 3.0–12.0)
Neutro Abs: 4.9 10*3/uL (ref 1.4–7.7)
Neutrophils Relative %: 67.2 % (ref 43.0–77.0)
RBC: 4.3 Mil/uL (ref 4.22–5.81)

## 2012-05-13 LAB — TSH: TSH: 1.57 u[IU]/mL (ref 0.35–5.50)

## 2012-05-15 NOTE — Assessment & Plan Note (Signed)
Stable without recent flare. No synovial thickening, no erythema about small or medium joints, pain is minimal and function remains good.   Plan Continue present regimen

## 2012-05-15 NOTE — Assessment & Plan Note (Signed)
Estimated Creatinine Clearance: 67.9 ml/min (by C-G formula based on Cr of 1.4).  Renal function in normal range.

## 2012-05-15 NOTE — Assessment & Plan Note (Signed)
Stable with no c/o at today's visit. Tolerated PPI therapy.

## 2012-05-15 NOTE — Assessment & Plan Note (Signed)
Lab reveals LDL to be excellent and well below goal of 100 or less; HDL is at goal of 40+. Liver functions are normal  Plan - continue present medications

## 2012-05-15 NOTE — Assessment & Plan Note (Signed)
Established OSA - did have hypersomnolence. Last titration study Nov '10. He admits to non-adherence with CPAP. Discussed the medical repercussions of untreated OSA including heart strain and pulmonary hypertension as a consequence.  Plan Advised to see Dr. Shelle Iron for re-consultation: newer masks with greater comfort.

## 2012-05-15 NOTE — Assessment & Plan Note (Signed)
Lab Results  Component Value Date   HGBA1C 6.1 05/13/2012   Excellent control on present regimen - no change indicated

## 2012-05-15 NOTE — Assessment & Plan Note (Signed)
For several years: weight stable at approx 275, BMI 34.  Plan  Diet management: smart food choices, PORTION SIZE CONTROL, regular exercise. Goal - to loose 1-2 lbs.month. Target weight - 220 lbs ( 2-4 year project)

## 2012-05-15 NOTE — Assessment & Plan Note (Signed)
BP Readings from Last 3 Encounters:  05/12/12 130/64  02/18/12 142/72  08/31/11 122/68   Good control on present medication. Renal function and electrolytes are normal  Plan Continue present regimen

## 2012-05-19 ENCOUNTER — Other Ambulatory Visit: Payer: Self-pay | Admitting: Internal Medicine

## 2012-05-19 DIAGNOSIS — R079 Chest pain, unspecified: Secondary | ICD-10-CM

## 2012-05-19 DIAGNOSIS — R6889 Other general symptoms and signs: Secondary | ICD-10-CM

## 2012-05-28 ENCOUNTER — Ambulatory Visit (HOSPITAL_COMMUNITY): Payer: BC Managed Care – PPO | Attending: Cardiovascular Disease | Admitting: Radiology

## 2012-05-28 VITALS — BP 152/80 | HR 65 | Ht 75.0 in | Wt 274.0 lb

## 2012-05-28 DIAGNOSIS — R6889 Other general symptoms and signs: Secondary | ICD-10-CM

## 2012-05-28 DIAGNOSIS — R0602 Shortness of breath: Secondary | ICD-10-CM

## 2012-05-28 DIAGNOSIS — R0609 Other forms of dyspnea: Secondary | ICD-10-CM | POA: Insufficient documentation

## 2012-05-28 DIAGNOSIS — R0989 Other specified symptoms and signs involving the circulatory and respiratory systems: Secondary | ICD-10-CM | POA: Insufficient documentation

## 2012-05-28 DIAGNOSIS — I4510 Unspecified right bundle-branch block: Secondary | ICD-10-CM

## 2012-05-28 DIAGNOSIS — I4949 Other premature depolarization: Secondary | ICD-10-CM

## 2012-05-28 DIAGNOSIS — E119 Type 2 diabetes mellitus without complications: Secondary | ICD-10-CM

## 2012-05-28 DIAGNOSIS — R079 Chest pain, unspecified: Secondary | ICD-10-CM | POA: Insufficient documentation

## 2012-05-28 MED ORDER — REGADENOSON 0.4 MG/5ML IV SOLN
0.4000 mg | Freq: Once | INTRAVENOUS | Status: AC
  Administered 2012-05-28: 0.4 mg via INTRAVENOUS

## 2012-05-28 MED ORDER — TECHNETIUM TC 99M SESTAMIBI GENERIC - CARDIOLITE
33.0000 | Freq: Once | INTRAVENOUS | Status: AC | PRN
  Administered 2012-05-28: 33 via INTRAVENOUS

## 2012-05-28 MED ORDER — TECHNETIUM TC 99M SESTAMIBI GENERIC - CARDIOLITE
11.0000 | Freq: Once | INTRAVENOUS | Status: AC | PRN
  Administered 2012-05-28: 11 via INTRAVENOUS

## 2012-05-28 NOTE — Progress Notes (Signed)
[facility] SITE 3 NUCLEAR MED [address] [place], [place] [address] [phone]    Cardiology Nuclear Med Study  Jon Hayden is a 72 y.o. male     MRN : [id number]     DOB: 1940/04/02  Procedure Date: 05/28/2012  Nuclear Med Background Indication for Stress Test:  Evaluation for Ischemia History:  ~10 yrs ago MPS:ok per patient Cardiac Risk Factors: History of Smoking, Hypertension, Lipids, NIDDM and Obesity  Symptoms:  DOE and Light-Headedness   Nuclear Pre-Procedure Caffeine/Decaff Intake:  None > 12 hrs NPO After: 6:30pm   Lungs:  Clear. O2 Sat: 96% on room air. IV 0.9% NS with Angio Cath:  20g  IV Site: R Antecubital x 1, tolerated well IV Started by:  Irean Hong, RN  Chest Size (in):  52 Cup Size: n/a  Height: 6\' 3"  (1.905 m)  Weight:  274 lb (124.286 kg)  BMI:  Body mass index is 34.25 kg/(m^2). Tech Comments:  No medication today    Nuclear Med Study 1 or 2 day study: 1 day  Stress Test Type:  Lexiscan  Reading MD: Kristeen Miss, MD  Order Authorizing Provider:  Illene Regulus, MD  Resting Radionuclide: Technetium 4m Sestamibi  Resting Radionuclide Dose: 11.0 mCi   Stress Radionuclide:  Technetium 75m Sestamibi  Stress Radionuclide Dose: 33.0 mCi           Stress Protocol Rest HR: 65 Stress HR: 86  Rest BP: 152/80 Stress BP: 146/76  Exercise Time (min): n/a METS: n/a   Predicted Max HR: 148 bpm % Max HR: 58.11 bpm Rate Pressure Product: 21308   Dose of Adenosine (mg):  n/a Dose of Lexiscan: 0.4 mg  Dose of Atropine (mg): n/a Dose of Dobutamine: n/a mcg/kg/min (at max HR)  Stress Test Technologist: Smiley Houseman, CMA-N  Nuclear Technologist:  Domenic Polite, CNMT     Rest Procedure:  Myocardial perfusion imaging was performed at rest 45 minutes following the intravenous administration of Technetium 46m Sestamibi.  Rest ECG: NSR - RBBB.  Stress Procedure:  The patient received IV Lexiscan 0.4 mg over 15-seconds.  Technetium 2m  Sestamibi injected at 30-seconds.  He did c/o brief chest and stomach pressure during Lexiscan. Quantitative spect images were obtained after a 45 minute delay.  Stress ECG: No significant change from baseline ECG  QPS Raw Data Images:  Normal; no motion artifact; normal heart/lung ratio. Stress Images:  There is mild apical thinning with normal uptake in other regions. Rest Images:  There is mild apical thinning with normal uptake in other regions. Subtraction (SDS):  No evidence of ischemia. Transient Ischemic Dilatation (Normal <1.22):  0.95 Lung/Heart Ratio (Normal <0.45):  0.34  Quantitative Gated Spect Images QGS EDV:  120 ml QGS ESV:  54 ml  Impression Exercise Capacity:  Lexiscan with no exercise. BP Response:  Normal blood pressure response. Clinical Symptoms:  No significant symptoms noted. ECG Impression:  No significant ST segment change suggestive of ischemia. Comparison with Prior Nuclear Study: No images to compare  Overall Impression:  Low risk stress nuclear study.  There is mild apical thinning which is consistent with artifact.   There is no evidence of ischemia.  LV Ejection Fraction: 55%.  LV Wall Motion:  NL LV Function; NL Wall Motion.  The apex contracts normally.    Vesta Mixer, Montez Hageman., MD, [facility] 05/28/2012, 4:31 PM Office - [phone] Pager [phone]

## 2012-05-30 ENCOUNTER — Ambulatory Visit (INDEPENDENT_AMBULATORY_CARE_PROVIDER_SITE_OTHER): Payer: BC Managed Care – PPO | Admitting: Pulmonary Disease

## 2012-05-30 ENCOUNTER — Encounter: Payer: Self-pay | Admitting: Pulmonary Disease

## 2012-05-30 VITALS — BP 142/88 | HR 68 | Temp 98.1°F | Ht 75.0 in | Wt 274.0 lb

## 2012-05-30 DIAGNOSIS — G4733 Obstructive sleep apnea (adult) (pediatric): Secondary | ICD-10-CM

## 2012-05-30 NOTE — Progress Notes (Signed)
Subjective:    Patient ID: Jon Hayden, male    DOB: 08-Jan-1941, 72 y.o.   MRN: [id number]  HPI The pt is a 72 y/o male who I have been asked to see for management of OSA.  He had a sleep study in 2010 which showed an AHI 70/hr, but was unable to have pressure optimized due to pressure induced central apneas.  He then had a bilevel titration, and showed an optimal pressure of 17/14.  The pt wore cpap for a period of time, but stopped wearing because it did not get rid of all of his awakenings during the night.  He did feel he slept better during the 5 hrs he did wear it.  He was unsure if it helped his daytime alertness.  He did not remember that we had also talked about impact to CV health.  He currently still has snoring, and his wife sleeps in a different bedroom.  He is unsure if he has ongoing apneas.  He has frequent awakenings at night, but feels rested in am's upon awakening.  He has definite sleep pressure with inactivity during the day.  He denies sleepiness with driving.  His weight is up 5 pounds from last visit with me in 2010, and his epworth score is 7.    Sleep Questionnaire What time do you typically go to bed?( Between what hours) 9pm 9pm at 1207 on 05/30/12 by Nita Sells, CMA How long does it take you to fall asleep? within 5 minutes within 5 minutes at 1207 on 05/30/12 by Marjo Bicker Mabe, CMA How many times during the night do you wake up? 5 5 at 1207 on 05/30/12 by Nita Sells, CMA What time do you get out of bed to start your day? 0500 0500 at 1207 on 05/30/12 by Nita Sells, CMA Do you drive or operate heavy machinery in your occupation? No No at 1207 on 05/30/12 by Nita Sells, CMA How much has your weight changed (up or down) over the past two years? (In pounds) 5 lb (2.268 kg) 5 lb (2.268 kg) at 1207 on 05/30/12 by Nita Sells, CMA Have you ever had a sleep study before? Yes Yes at 1207 on 05/30/12 by Nita Sells, CMA If yes, location of study?  [facility]  at 1207 on 05/30/12 by Nita Sells, CMA If yes, date of study? 2000 2000 at 1207 on 05/30/12 by Nita Sells, CMA Do you currently use CPAP? NoNo has BiPap---set at 17-10?? at 1207 on 05/30/12 by Nita Sells, CMA Do you wear oxygen at any time? No No at 1207 on 05/30/12 by Marjo Bicker Mabe, CMA    Review of Systems  Constitutional: Negative for fever and unexpected weight change.  HENT: Positive for congestion, rhinorrhea, sneezing and postnasal drip. Negative for ear pain, nosebleeds, sore throat, trouble swallowing, dental problem and sinus pressure.   Eyes: Negative for redness and itching.       Watery eyes  Respiratory: Negative for cough, chest tightness, shortness of breath and wheezing.   Cardiovascular: Negative for palpitations and leg swelling.  Gastrointestinal: Negative for nausea and vomiting.  Genitourinary: Negative for dysuria.  Musculoskeletal: Negative for joint swelling.  Skin: Negative for rash.  Neurological: Negative for headaches.  Hematological: Does not bruise/bleed easily.  Psychiatric/Behavioral: Negative for dysphoric mood. The patient is not nervous/anxious.        Objective:   Physical Exam Constitutional:  Overweight  male, no acute distress  HENT:  Nares patent without discharge, but septal deviation to the left.  Oropharynx without exudate, palate and uvula are thick and elongated .  Eyes:  Perrla, eomi, no scleral icterus  Neck:  No JVD, no TMG  Cardiovascular:  Normal rate, regular rhythm, no rubs or gallops.  2/6 sem        Intact distal pulses  Pulmonary :  Normal breath sounds, no stridor or respiratory distress   No rales, rhonchi, or wheezing  Abdominal:  Soft, nondistended, bowel sounds present.  No tenderness noted.   Musculoskeletal:  No lower extremity edema noted.  Lymph Nodes:  No cervical lymphadenopathy noted  Skin:  No cyanosis noted  Neurologic:  Alert, appropriate, moves all 4  extremities without obvious deficit.         Assessment & Plan:

## 2012-05-30 NOTE — Patient Instructions (Addendum)
Will get you restarted back on bipap.  Will start at lower pressure to allow you to adapt, then get to your optimal level Will get you re-fitted with a new mask Work on weight loss followup with me in 8 weeks, but call if you are having tolerance issues.

## 2012-05-30 NOTE — Assessment & Plan Note (Signed)
The pt has a history of severe osa, and quit using bilevel in the past due to his perception that it was not helping him.  I have explained that it is common for [place] to have spontaneous awakenings during the night as we get older, but the most important thing is that we have restorative sleep when we are sleeping.  I also stressed the impact of severe osa on his CV health.  He is willing to try the bilevel again, and will have his machine checked to make sure is in working order.  Will also start back on a more moderate pressure, then get to optimal pressure.  Finally, I have encouraged him to work aggressively on weight loss.

## 2012-06-04 ENCOUNTER — Telehealth: Payer: Self-pay

## 2012-06-04 NOTE — Telephone Encounter (Signed)
Low risk study - no evidence of ischemia (blockage), good ejection fraction.  Please activate Mychart.

## 2012-06-04 NOTE — Telephone Encounter (Signed)
Left message on machine for pt to return my call  

## 2012-06-04 NOTE — Telephone Encounter (Signed)
Pt informed of results.

## 2012-06-04 NOTE — Telephone Encounter (Signed)
Pt called requesting results of Nuclear stress test completed 05/28/2012.

## 2012-06-10 ENCOUNTER — Ambulatory Visit (INDEPENDENT_AMBULATORY_CARE_PROVIDER_SITE_OTHER): Payer: BC Managed Care – PPO

## 2012-06-10 DIAGNOSIS — Z23 Encounter for immunization: Secondary | ICD-10-CM

## 2012-06-17 ENCOUNTER — Ambulatory Visit (INDEPENDENT_AMBULATORY_CARE_PROVIDER_SITE_OTHER): Payer: BC Managed Care – PPO | Admitting: Gastroenterology

## 2012-06-17 ENCOUNTER — Encounter: Payer: Self-pay | Admitting: Gastroenterology

## 2012-06-17 ENCOUNTER — Telehealth: Payer: Self-pay | Admitting: Internal Medicine

## 2012-06-17 VITALS — BP 130/72 | HR 72 | Ht 75.0 in | Wt 278.0 lb

## 2012-06-17 DIAGNOSIS — K625 Hemorrhage of anus and rectum: Secondary | ICD-10-CM

## 2012-06-17 MED ORDER — HYDROCORTISONE ACETATE 25 MG RE SUPP: RECTAL | Status: DC

## 2012-06-17 NOTE — Telephone Encounter (Signed)
Patient states he had steroid injection into hemorrhoids by Dr. Derrell Lolling that did not take. Had a bowel movement this AM. He went to his chair and had blood/liquid stool in his trousers. He has had more bright, red bleeding since then. Scheduled with Doug Sou, PA today at 2:15 PM.

## 2012-06-17 NOTE — Patient Instructions (Addendum)
We sent a prescription to An[facility] Supporitories to [facility] [place]. We made you a follow up appointment with Dr. Juanda Chance on 08-04-2012 at 1:45 PM.

## 2012-06-17 NOTE — Progress Notes (Signed)
Reviewed and agree.

## 2012-06-17 NOTE — Progress Notes (Signed)
06/17/2012 DEWARREN LEDBETTER [id number] 1940/09/07   History of Present Illness: Patient is a pleasant 72 year old male who presents to our office today with complaints of rectal bleeding.  Says that he has a hemorrhoid that is always spotting a little bit of blood on a daily basis; had this injected by Dr. Derrell Lolling, but did not work.  He says that today he had a BM then a few minutes later he passes gas and blood soaked through his pants.  This was the only episode, however, he was concerned because it was decent amount of blood.  Says that he has some minimal LLQ discomfort as well, but this occurs intermittently anyway.  Current Medications, Allergies, Past Medical History, Past Surgical History, Family History and Social History were reviewed in [employer] record.   Physical Exam: BP 130/72  Pulse 72  Ht 6\' 3"  (1.905 m)  Wt 278 lb (126.1 kg)  BMI 34.75 kg/m2 General: Well developed, white male in no acute distress Head: Normocephalic and atraumatic Eyes:  sclerae anicteric, conjunctiva pink  Ears: Normal auditory acuity Lungs: Clear throughout to auscultation Heart: Regular rate and rhythm Abdomen: Soft, non-tender and non-distended. No masses, no hepatomegaly. Normal bowel sounds. Rectal: No external hemorrhoids noted.  Light brown stool on exam glove with no blood.  Internal hemorrhoid noted on anoscopy but no blood seen. Musculoskeletal: Symmetrical with no gross deformities  Extremities: No edema  Neurological: Alert oriented x 4, grossly nonfocal Psychological:  Alert and cooperative. Normal mood and affect  Assessment and Recommendations: -Rectal bleeding:  He does have an internal hemorrhoid that spots on a regular basis.  This may have been worsening of his hemorrhoidal bleeding or a low volume diverticular bleed this morning.  Will give him anusol suppositories to use at bedtime.  Will monitor for recurrent bleeding and follow-up with Dr. Juanda Chance in several  weeks.

## 2012-06-18 ENCOUNTER — Encounter: Payer: Self-pay | Admitting: Internal Medicine

## 2012-06-19 ENCOUNTER — Other Ambulatory Visit: Payer: Self-pay | Admitting: Internal Medicine

## 2012-07-04 ENCOUNTER — Other Ambulatory Visit: Payer: Self-pay

## 2012-07-04 MED ORDER — ATORVASTATIN CALCIUM 20 MG PO TABS: 20.0000 mg | ORAL_TABLET | Freq: Every day | ORAL | Status: DC

## 2012-07-04 MED ORDER — PIOGLITAZONE HCL 30 MG PO TABS: 30.0000 mg | ORAL_TABLET | Freq: Every day | ORAL | Status: DC

## 2012-07-04 MED ORDER — FUROSEMIDE 40 MG PO TABS: 40.0000 mg | ORAL_TABLET | Freq: Every day | ORAL | Status: DC

## 2012-07-04 MED ORDER — ESOMEPRAZOLE MAGNESIUM 40 MG PO CPDR: 40.0000 mg | DELAYED_RELEASE_CAPSULE | Freq: Every day | ORAL | Status: DC

## 2012-07-04 MED ORDER — DILTIAZEM HCL ER COATED BEADS 300 MG PO CP24: 300.0000 mg | ORAL_CAPSULE | Freq: Every day | ORAL | Status: DC

## 2012-07-04 MED ORDER — METFORMIN HCL 500 MG PO TABS: 500.0000 mg | ORAL_TABLET | Freq: Two times a day (BID) | ORAL | Status: DC

## 2012-08-04 ENCOUNTER — Ambulatory Visit: Payer: BC Managed Care – PPO | Admitting: Internal Medicine

## 2012-08-12 ENCOUNTER — Encounter: Payer: Self-pay | Admitting: Internal Medicine

## 2012-08-12 ENCOUNTER — Ambulatory Visit (INDEPENDENT_AMBULATORY_CARE_PROVIDER_SITE_OTHER): Payer: BC Managed Care – PPO | Admitting: Internal Medicine

## 2012-08-12 VITALS — BP 130/70 | HR 68 | Ht 75.0 in | Wt 277.0 lb

## 2012-08-12 DIAGNOSIS — K625 Hemorrhage of anus and rectum: Secondary | ICD-10-CM

## 2012-08-12 MED ORDER — HYDROCORTISONE 2.5 % RE CREA: TOPICAL_CREAM | Freq: Two times a day (BID) | RECTAL | Status: DC
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
## 2012-08-12 NOTE — Patient Instructions (Addendum)
We have sent the following medications to your pharmacy for you to pick up at your convenience:  Anusol  We have entered you in our recall system for a repeat Colonoscopy for 12/2013. CC:  Illene Regulus MD

## 2012-08-18 ENCOUNTER — Ambulatory Visit (INDEPENDENT_AMBULATORY_CARE_PROVIDER_SITE_OTHER): Payer: BC Managed Care – PPO | Admitting: Pulmonary Disease

## 2012-08-18 ENCOUNTER — Encounter: Payer: Self-pay | Admitting: Pulmonary Disease

## 2012-08-18 VITALS — BP 120/72 | HR 68 | Temp 97.7°F | Ht 75.0 in | Wt 277.0 lb

## 2012-08-18 DIAGNOSIS — G4733 Obstructive sleep apnea (adult) (pediatric): Secondary | ICD-10-CM

## 2012-08-18 NOTE — Assessment & Plan Note (Signed)
The patient has done better since being back on bilevel, but still has not been increased to the optimal pressure despite the order being written at the last visit.  He is also struggling with mask fit.  I will get him back to his medical equipment company to increase his pressure, and also work with him on a better fitting mask.  If he continues to struggle with the mask, I will get him back to the sleep center for a formal fitting.  Finally, I encouraged him to work aggressively on weight loss.

## 2012-08-18 NOTE — Progress Notes (Signed)
  Subjective:    Patient ID: Jon Hayden, male    DOB: 03/07/1941, 72 y.o.   MRN: [id number]  HPI The patient comes in today for followup of his obstructive sleep apnea.  At the last visit, he was started back on bilevel, and an order given to increase his pressure to 17/14 in a few weeks.  This unfortunately has never been done.  He also continues to have issues with his mask fit, and his medical equipment company is not working with him consistently to fix the issue.  He is definitely seen an improvement since being back on a positive pressure device, and his wife has not heard breakthrough snoring.  However, the patient is having issues with mask leak, and feels the pressure is not quite high enough.   Review of Systems  Constitutional: Negative for fever and unexpected weight change.  HENT: Positive for congestion, sore throat and rhinorrhea. Negative for ear pain, nosebleeds, sneezing, trouble swallowing, dental problem, postnasal drip and sinus pressure.   Eyes: Positive for redness and itching.  Respiratory: Positive for cough, chest tightness and shortness of breath. Negative for wheezing.   Cardiovascular: Positive for leg swelling. Negative for palpitations.  Gastrointestinal: Negative for nausea and vomiting.  Genitourinary: Negative for dysuria.  Musculoskeletal: Positive for joint swelling.  Skin: Negative for rash.  Neurological: Positive for headaches.  Hematological: Bruises/bleeds easily.  Psychiatric/Behavioral: Negative for dysphoric mood. The patient is not nervous/anxious.        Objective:   Physical Exam Obese male in no acute distress Nose without purulence or discharge noted No skin breakdown or pressure necrosis from the CPAP mask Neck without lymphadenopathy or thyromegaly Lower extremities with mild edema, no cyanosis Alert and oriented, moves all 4 extremities.       Assessment & Plan:

## 2012-08-18 NOTE — Patient Instructions (Addendum)
Will have your pressure increased to 17/14 as we ordered previously. Work with Nurse, learning disability company on mask fit.  If you are still struggling, we can refer you to the sleep center during the day for a mask fitting session.  Work on weight loss followup with me in one year, but call if having issues.

## 2012-09-09 ENCOUNTER — Other Ambulatory Visit: Payer: Self-pay | Admitting: Internal Medicine

## 2012-09-30 ENCOUNTER — Other Ambulatory Visit: Payer: Self-pay | Admitting: Internal Medicine

## 2012-10-07 ENCOUNTER — Encounter (HOSPITAL_COMMUNITY): Payer: Self-pay | Admitting: *Deleted

## 2012-10-07 ENCOUNTER — Observation Stay (HOSPITAL_COMMUNITY)
Admission: EM | Admit: 2012-10-07 | Discharge: 2012-10-08 | DRG: 174 | Disposition: A | Discharge status: Home / Self Care | Payer: BC Managed Care – PPO | Attending: Internal Medicine | Admitting: Internal Medicine

## 2012-10-07 DIAGNOSIS — K922 Gastrointestinal hemorrhage, unspecified: Secondary | ICD-10-CM

## 2012-10-07 DIAGNOSIS — I129 Hypertensive chronic kidney disease with stage 1 through stage 4 chronic kidney disease, or unspecified chronic kidney disease: Secondary | ICD-10-CM | POA: Insufficient documentation

## 2012-10-07 DIAGNOSIS — Z87891 Personal history of nicotine dependence: Secondary | ICD-10-CM | POA: Insufficient documentation

## 2012-10-07 DIAGNOSIS — Z7982 Long term (current) use of aspirin: Secondary | ICD-10-CM | POA: Insufficient documentation

## 2012-10-07 DIAGNOSIS — Z79899 Other long term (current) drug therapy: Secondary | ICD-10-CM | POA: Insufficient documentation

## 2012-10-07 DIAGNOSIS — G4733 Obstructive sleep apnea (adult) (pediatric): Secondary | ICD-10-CM | POA: Insufficient documentation

## 2012-10-07 DIAGNOSIS — E785 Hyperlipidemia, unspecified: Secondary | ICD-10-CM | POA: Diagnosis present

## 2012-10-07 DIAGNOSIS — E119 Type 2 diabetes mellitus without complications: Secondary | ICD-10-CM | POA: Diagnosis present

## 2012-10-07 DIAGNOSIS — K625 Hemorrhage of anus and rectum: Principal | ICD-10-CM | POA: Insufficient documentation

## 2012-10-07 DIAGNOSIS — R1084 Generalized abdominal pain: Secondary | ICD-10-CM

## 2012-10-07 DIAGNOSIS — K219 Gastro-esophageal reflux disease without esophagitis: Secondary | ICD-10-CM | POA: Diagnosis present

## 2012-10-07 DIAGNOSIS — K5730 Diverticulosis of large intestine without perforation or abscess without bleeding: Secondary | ICD-10-CM | POA: Diagnosis present

## 2012-10-07 DIAGNOSIS — N189 Chronic kidney disease, unspecified: Secondary | ICD-10-CM | POA: Diagnosis present

## 2012-10-07 DIAGNOSIS — Z860100 Personal history of colon polyps, unspecified: Secondary | ICD-10-CM | POA: Insufficient documentation

## 2012-10-07 DIAGNOSIS — I10 Essential (primary) hypertension: Secondary | ICD-10-CM | POA: Diagnosis present

## 2012-10-07 DIAGNOSIS — N183 Chronic kidney disease, stage 3 unspecified: Secondary | ICD-10-CM | POA: Insufficient documentation

## 2012-10-07 DIAGNOSIS — R109 Unspecified abdominal pain: Secondary | ICD-10-CM | POA: Insufficient documentation

## 2012-10-07 DIAGNOSIS — M069 Rheumatoid arthritis, unspecified: Secondary | ICD-10-CM | POA: Insufficient documentation

## 2012-10-07 DIAGNOSIS — Z87442 Personal history of urinary calculi: Secondary | ICD-10-CM | POA: Insufficient documentation

## 2012-10-07 LAB — CBC WITH DIFFERENTIAL/PLATELET
Basophils Absolute: 0 10*3/uL (ref 0.0–0.1)
Basophils Relative: 1 % (ref 0–1)
Eosinophils Absolute: 0.2 10*3/uL (ref 0.0–0.7)
Eosinophils Relative: 3 % (ref 0–5)
HCT: 39.5 % (ref 39.0–52.0)
Hemoglobin: 13.3 g/dL (ref 13.0–17.0)
Lymphocytes Relative: 21 % (ref 12–46)
Lymphs Abs: 1.6 10*3/uL (ref 0.7–4.0)
MCH: 32.2 pg (ref 26.0–34.0)
MCHC: 33.7 g/dL (ref 30.0–36.0)
MCV: 95.6 fL (ref 78.0–100.0)
Monocytes Absolute: 0.8 10*3/uL (ref 0.1–1.0)
Monocytes Relative: 10 % (ref 3–12)
Neutro Abs: 5 10*3/uL (ref 1.7–7.7)
Neutrophils Relative %: 66 % (ref 43–77)
Platelets: 190 10*3/uL (ref 150–400)
RBC: 4.13 MIL/uL — ABNORMAL LOW (ref 4.22–5.81)
RDW: 14.1 % (ref 11.5–15.5)
WBC: 7.6 10*3/uL (ref 4.0–10.5)

## 2012-10-07 LAB — BASIC METABOLIC PANEL
BUN: 32 mg/dL — ABNORMAL HIGH (ref 6–23)
CO2: 24 mEq/L (ref 19–32)
Calcium: 9.1 mg/dL (ref 8.4–10.5)
Chloride: 105 mEq/L (ref 96–112)
Creatinine, Ser: 1.36 mg/dL — ABNORMAL HIGH (ref 0.50–1.35)
GFR calc Af Amer: 58 mL/min — ABNORMAL LOW (ref >90)
GFR calc non Af Amer: 50 mL/min — ABNORMAL LOW (ref >90)
Glucose, Bld: 160 mg/dL — ABNORMAL HIGH (ref 70–99)
Potassium: 3.9 mEq/L (ref 3.5–5.1)
Sodium: 139 mEq/L (ref 135–145)

## 2012-10-07 LAB — TYPE AND SCREEN
ABO/RH(D): O NEG
Antibody Screen: NEGATIVE

## 2012-10-07 LAB — CBC
HCT: 38 % — ABNORMAL LOW (ref 39.0–52.0)
Hemoglobin: 12.8 g/dL — ABNORMAL LOW (ref 13.0–17.0)
MCV: 95.7 fL (ref 78.0–100.0)
RDW: 14 % (ref 11.5–15.5)
WBC: 7.7 10*3/uL (ref 4.0–10.5)

## 2012-10-07 LAB — GLUCOSE, CAPILLARY
Glucose-Capillary: 86 mg/dL (ref 70–99)
Glucose-Capillary: 95 mg/dL (ref 70–99)

## 2012-10-07 LAB — HEMOGLOBIN A1C: Hgb A1c MFr Bld: 5.9 % — ABNORMAL HIGH (ref <5.7)

## 2012-10-07 MED ORDER — IRBESARTAN 300 MG PO TABS
300.0000 mg | ORAL_TABLET | Freq: Every day | ORAL | Status: DC
  Administered 2012-10-08: 300 mg via ORAL
  Filled 2012-10-07: qty 1

## 2012-10-07 MED ORDER — SODIUM CHLORIDE 0.9 % IJ SOLN
3.0000 mL | Freq: Two times a day (BID) | INTRAMUSCULAR | Status: DC
  Administered 2012-10-07 – 2012-10-08 (×2): 3 mL via INTRAVENOUS

## 2012-10-07 MED ORDER — TRAVOPROST (BAK FREE) 0.004 % OP SOLN
1.0000 [drp] | Freq: Every day | OPHTHALMIC | Status: DC
  Administered 2012-10-07: 1 [drp] via OPHTHALMIC
  Filled 2012-10-07: qty 2.5

## 2012-10-07 MED ORDER — ONDANSETRON HCL 4 MG PO TABS: 4.0000 mg | ORAL_TABLET | Freq: Four times a day (QID) | ORAL | Status: DC | PRN

## 2012-10-07 MED ORDER — SODIUM CHLORIDE 0.9 % IV SOLN: INTRAVENOUS | Status: DC

## 2012-10-07 MED ORDER — FUROSEMIDE 40 MG PO TABS
40.0000 mg | ORAL_TABLET | Freq: Every morning | ORAL | Status: DC
  Administered 2012-10-08: 40 mg via ORAL
  Filled 2012-10-07: qty 1

## 2012-10-07 MED ORDER — ACETAMINOPHEN 500 MG PO TABS: 1000.0000 mg | ORAL_TABLET | Freq: Four times a day (QID) | ORAL | Status: DC | PRN

## 2012-10-07 MED ORDER — VITAMIN C 500 MG PO TABS
1000.0000 mg | ORAL_TABLET | Freq: Every morning | ORAL | Status: DC
  Administered 2012-10-08: 1000 mg via ORAL
  Filled 2012-10-07: qty 2

## 2012-10-07 MED ORDER — SODIUM CHLORIDE 0.9 % IV SOLN
Freq: Once | INTRAVENOUS | Status: AC
  Administered 2012-10-07: 08:00:00 via INTRAVENOUS

## 2012-10-07 MED ORDER — ADULT MULTIVITAMIN W/MINERALS CH
1.0000 | ORAL_TABLET | Freq: Every morning | ORAL | Status: DC
  Administered 2012-10-08: 1 via ORAL
  Filled 2012-10-07 (×2): qty 1

## 2012-10-07 MED ORDER — VITAMIN D3 25 MCG (1000 UNIT) PO TABS
1000.0000 [IU] | ORAL_TABLET | Freq: Every morning | ORAL | Status: DC
  Administered 2012-10-08: 1000 [IU] via ORAL
  Filled 2012-10-07 (×2): qty 1

## 2012-10-07 MED ORDER — PANTOPRAZOLE SODIUM 40 MG PO TBEC
80.0000 mg | DELAYED_RELEASE_TABLET | Freq: Two times a day (BID) | ORAL | Status: DC
  Administered 2012-10-07 – 2012-10-08 (×2): 80 mg via ORAL
  Filled 2012-10-07 (×2): qty 2
  Filled 2012-10-07: qty 1
  Filled 2012-10-07: qty 2

## 2012-10-07 MED ORDER — ATORVASTATIN CALCIUM 20 MG PO TABS: 20.0000 mg | ORAL_TABLET | Freq: Every day | ORAL | Status: DC

## 2012-10-07 MED ORDER — HYDROXYCHLOROQUINE SULFATE 200 MG PO TABS
200.0000 mg | ORAL_TABLET | Freq: Two times a day (BID) | ORAL | Status: DC
  Administered 2012-10-08: 200 mg via ORAL
  Filled 2012-10-07 (×4): qty 1

## 2012-10-07 MED ORDER — ONDANSETRON HCL 4 MG/2ML IJ SOLN: 4.0000 mg | Freq: Four times a day (QID) | INTRAMUSCULAR | Status: DC | PRN

## 2012-10-07 MED ORDER — SODIUM CHLORIDE 0.9 % IV SOLN
INTRAVENOUS | Status: DC
  Administered 2012-10-07 (×2): via INTRAVENOUS

## 2012-10-07 MED ORDER — OMEGA-3 FATTY ACIDS 1000 MG PO CAPS: 1.0000 g | ORAL_CAPSULE | Freq: Every day | ORAL | Status: DC

## 2012-10-07 MED ORDER — MORPHINE SULFATE 2 MG/ML IJ SOLN: 1.0000 mg | INTRAMUSCULAR | Status: DC | PRN

## 2012-10-07 MED ORDER — OMEGA-3-ACID ETHYL ESTERS 1 G PO CAPS
1.0000 g | ORAL_CAPSULE | Freq: Every day | ORAL | Status: DC
  Administered 2012-10-07: 1 g via ORAL
  Filled 2012-10-07 (×2): qty 1

## 2012-10-07 MED ORDER — DILTIAZEM HCL ER COATED BEADS 300 MG PO CP24
300.0000 mg | ORAL_CAPSULE | Freq: Every morning | ORAL | Status: DC
  Administered 2012-10-08: 300 mg via ORAL
  Filled 2012-10-07: qty 1

## 2012-10-07 MED ORDER — ONDANSETRON HCL 4 MG/2ML IJ SOLN: 4.0000 mg | Freq: Three times a day (TID) | INTRAMUSCULAR | Status: DC | PRN

## 2012-10-07 MED ORDER — ROSUVASTATIN CALCIUM 10 MG PO TABS
10.0000 mg | ORAL_TABLET | Freq: Every day | ORAL | Status: DC
  Administered 2012-10-07: 10 mg via ORAL
  Filled 2012-10-07 (×2): qty 1

## 2012-10-07 MED ORDER — INSULIN ASPART 100 UNIT/ML ~~LOC~~ SOLN: 0.0000 [IU] | Freq: Three times a day (TID) | SUBCUTANEOUS | Status: DC
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
## 2012-10-07 NOTE — ED Notes (Addendum)
Pt reports hx of ruptured colon due to divertiuca issues. At one point 2-3 years ago pt had colostomy.  Doctor Norins pcp.  B[facility]ie gastroenterologist Pt started having frank rectal bleeding this morning. Blood on pants and wheelchair. Reports abdominal discomfort 2/10.   Last food intake 0630
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
## 2012-10-07 NOTE — Progress Notes (Signed)
Placed pt on BIPAP. Settings at home are 17/14 per patient. Patient is tolerating well.

## 2012-10-08 LAB — BASIC METABOLIC PANEL
Calcium: 9 mg/dL (ref 8.4–10.5)
Creatinine, Ser: 1.41 mg/dL — ABNORMAL HIGH (ref 0.50–1.35)
GFR calc Af Amer: 56 mL/min — ABNORMAL LOW (ref >90)
GFR calc non Af Amer: 48 mL/min — ABNORMAL LOW (ref >90)
Sodium: 139 mEq/L (ref 135–145)

## 2012-10-08 LAB — CBC
HCT: 35.7 % — ABNORMAL LOW (ref 39.0–52.0)
Hemoglobin: 12.1 g/dL — ABNORMAL LOW (ref 13.0–17.0)
Hemoglobin: 12.8 g/dL — ABNORMAL LOW (ref 13.0–17.0)
MCH: 32.2 pg (ref 26.0–34.0)
MCHC: 33.9 g/dL (ref 30.0–36.0)
MCHC: 33.4 g/dL (ref 30.0–36.0)
MCV: 94.9 fL (ref 78.0–100.0)
Platelets: 181 10*3/uL (ref 150–400)
RDW: 13.8 % (ref 11.5–15.5)

## 2012-10-08 LAB — LIPID PANEL
HDL: 35 mg/dL — ABNORMAL LOW (ref >39)
LDL Cholesterol: 52 mg/dL (ref 0–99)
Total CHOL/HDL Ratio: 2.9 RATIO
Triglycerides: 68 mg/dL (ref <150)
VLDL: 14 mg/dL (ref 0–40)

## 2012-10-08 LAB — GLUCOSE, CAPILLARY
Glucose-Capillary: 96 mg/dL (ref 70–99)
Glucose-Capillary: 118 mg/dL — ABNORMAL HIGH (ref 70–99)

## 2012-10-08 NOTE — Progress Notes (Signed)
[facility] Progress Note  Subjective:  Feels good.  No bleeding since 5 pm yesterday, which was just small amount of oozing at that time.  Objective:  Vital signs in last 24 hours: Temp:  [97.8 F (36.6 C)-98.4 F (36.9 C)] 98.1 F (36.7 C) (07/23 0549) Pulse Rate:  [58-84] 58 (07/23 0549) Resp:  [14-20] 20 (07/23 0549) BP: (139-150)/(62-67) 139/67 mmHg (07/23 0549) SpO2:  [96 %-100 %] 99 % (07/23 0549) Weight:  [288 lb 5.8 oz (130.8 kg)] 288 lb 5.8 oz (130.8 kg) (07/22 1003) Last BM Date: 10/07/12 General:   Alert, Well-developed, in NAD Heart:  Regular rate and rhythm; no murmurs Pulm:  CTAB.  No W/R/R. Abdomen:  Soft, nontender and nondistended. Normal bowel sounds, without guarding, and without rebound.   Extremities:  Without edema. Neurologic:  Alert and  oriented x4;  grossly normal neurologically. Psych:  Alert and cooperative. Normal mood and affect.   Lab Results:  Recent Labs  10/07/12 0731 10/07/12 1346 10/08/12 0245  WBC 7.6 7.7 6.2  HGB 13.3 12.8* 12.1*  HCT 39.5 38.0* 35.7*  PLT 190 193 149*   BMET  Recent Labs  10/07/12 0731 10/08/12 0245  NA 139 139  K 3.9 4.4  CL 105 106  CO2 24 29  GLUCOSE 160* 97  BUN 32* 20  CREATININE 1.36* 1.41*  CALCIUM 9.1 9.0   Assessment / Plan: -LGIB: Likely diverticular bleed.  No further bleeding since last evening.  Hgb fairly stable.  Continue to monitor.  Ok for D/C later this evening if continues without bleeding. -History of diverticulitis with perforation and diverting colostomy with reversal, all in 2010    LOS: 1 day   ZEHR, JESSICA D.  10/08/2012, 8:59 AM  Pager number [phone]  [facility] Attending  I have also seen and assessed the patient and agree with the above note.  Seems to be stopping so dc later today is reasonable. He is aware of possibility of recurrent bleeding.  Iva Boop, MD, [facility] [phone] (pager) 10/08/2012 12:33 PM

## 2012-10-08 NOTE — Progress Notes (Signed)
NO stools from late in the afternoon yesterday to 0600.

## 2012-10-08 NOTE — Progress Notes (Signed)
Subjective: Feels good, has an appetite, no recurrent bleeding  Objective: Lab:  Recent Labs  10/07/12 0731 10/07/12 1346 10/08/12 0245  WBC 7.6 7.7 6.2  NEUTROABS 5.0  --   --   HGB 13.3 12.8* 12.1*  HCT 39.5 38.0* 35.7*  MCV 95.6 95.7 94.9  PLT 190 193 149*    Recent Labs  10/07/12 0731 10/08/12 0245  NA 139 139  K 3.9 4.4  CL 105 106  GLUCOSE 160* 97  BUN 32* 20  CREATININE 1.36* 1.41*  CALCIUM 9.1 9.0    Imaging:  Scheduled Meds: . cholecalciferol  1,000 Units Oral q morning - 10a  . diltiazem  300 mg Oral q morning - 10a  . furosemide  40 mg Oral q morning - 10a  . hydroxychloroquine  200 mg Oral BID  . insulin aspart  0-20 Units Subcutaneous TID WC  . irbesartan  300 mg Oral Daily  . multivitamin with minerals  1 tablet Oral q morning - 10a  . omega-3 acid ethyl esters  1 g Oral QAC supper  . pantoprazole  80 mg Oral BID  . rosuvastatin  10 mg Oral q1800  . sodium chloride  3 mL Intravenous Q12H  . Travoprost (BAK Free)  1 drop Both Eyes QHS  . vitamin C  1,000 mg Oral q morning - 10a   Continuous Infusions: . sodium chloride 10 mL (10/07/12 1424)   PRN Meds:.acetaminophen, morphine injection, ondansetron (ZOFRAN) IV, ondansetron   Physical Exam: Filed Vitals:   10/08/12 0549  BP: 139/67  Pulse: 58  Temp: 98.1 F (36.7 C)  Resp: 20        Assessment/Plan: 1. GI - diverticular bleed w/o recurrence. Dr. Marvell Fuller note reviewed. Plan D/c tele  Advance diet  If no recurrent bleed and stable Hgb for D/C this PM   [facility] (o) [phone]; (c) [phone] Call-grp - [facility]  Tele: [phone]  10/08/2012, 7:27 AM

## 2012-10-08 NOTE — Progress Notes (Signed)
Good day - no recurrent hematochezia. Hgb 12.8 g up from 12.1 g at 0245  He is for discharge home.  Dictated [id number]

## 2012-10-09 NOTE — Discharge Summary (Signed)
NAMEWAYLAN, BUSTA NO.:  [id number]  MEDICAL RECORD NO.:  [id number]  LOCATION:  1426                         FACILITY:  [facility]  PHYSICIAN:  Rosalyn Gess. Norins, MD  DATE OF BIRTH:  Aug 12, 1940  DATE OF ADMISSION:  10/07/2012 DATE OF DISCHARGE:  10/08/2012                              DISCHARGE SUMMARY   ADMITTING DIAGNOSIS:  Hematochezia.  DISCHARGE DIAGNOSIS:  Hematochezia most likely due to diverticular bleed.  CONSULTANTS:  Iva Boop, MD, [facility] for [facility].  PROCEDURES:  None.  HISTORY OF PRESENT ILLNESS:  Mr. Kittell is a 72 year old Caucasian gentleman followed for multiple medical problems including history of diverticulitis with perforation in the past.  He has had diverticular bleed in the past.  The patient was in his usual state of health until the morning of admission when he noticed the passage has significant frank red blood that was not associated with bowel movements.  He had no chest pain, chest discomfort, lightheadedness or other systemic symptoms.  He presented to the [facility] Emergency Department with rectal bleeding, was subsequently admitted for observation for probable diverticular bleed.  At the time of admission, the patient's hemoglobin was 12.8 g down from a baseline of 13.3 g.  His examination in the emergency department was otherwise unremarkable.  HOSPITAL COURSE:  The patient was admitted to the telemetry unit for observation.  He had followup H and H, which revealed a drop in hemoglobin to 12.1 at 0245 hours on the day of discharge, and at 12:45 p.m. on the day of discharge, hemoglobin was 12.8 g.  The patient remained asymptomatic.  He had no hematochezia since 1700 hours on the 22nd.  The patient was seen in consultation by the GI Service.  He did not feel the patient required any intervention at this time.  They did agree with the patient being discharged after going for least 24 hours without  any recurrent bleeding.  DISCHARGE EXAMINATION:  VITAL SIGNS:  Temperature was 98.5, blood pressure 139/71, heart rate 67, respirations 15, oxygen saturations 95% on room air. GENERAL APPEARANCE:  This is an overweight, Caucasian gentleman sitting in a Geri chair, dressed and ready to go. CARDIOVASCULAR:  The patient had regular rate and rhythm. PULMONARY:  The patient had normal respirations with no increased work of breathing. NEUROLOGIC:  The patient is awake, alert, and oriented to person, place, time and context.  No further examination conducted.  FINAL LABORATORY:  From 12:35 p.m. on the day of discharge, white count was 6700, hemoglobin was 12.8 g, platelet count was 181,000.  Additional laboratory; the patient did have a hemoglobin A1c on October 07, 2012, that was normal at 5.9%.  DISCHARGE MEDICATIONS:  The patient will resume all of his home medications as follows: 1. Tylenol 1000 mg q.6 p.r.n. for arthritic pain. 2. Aspirin 81 mg daily, to be resumed in 3 days. 3. Atorvastatin 20 mg daily. 4. Vitamin D 1000 units daily. 5. Diltiazem CD 300 mg daily. 6. Nexium 40 mg q.a.m. 7. Fish oil 1000 mg capsules daily. 8. Lasix 40 mg q.a.m. 9. Anusol-HC 2.5% rectal cream applied b.i.d. as needed for     hemorrhoids. 10.Plaquenil  200 mg 1 tablet with b.i.d. 11.Metformin 500 mg b.i.d. 12.Multivitamin daily. 13.Niacin 1000 mg q.a.m. 14.Actos 30 mg q.a.m. 15.Piroxicam 20 mg daily as needed for flare of joint pain. 16.Micardis 80 mg once daily. 17.Travatan 0.004% eye drops, 1 drop to both eyes at bedtime.  DISPOSITION:  The patient is discharged to home.  FOLLOWUP:  The patient does not require follow up at this time.  He will call the office for any recurrent hematochezia.  The patient is instructed to keep his routine followup appointments for general health maintenance.     Rosalyn Gess Norins, MD     MEN/MEDQ  D:  10/08/2012  T:  10/09/2012  Job:  [id number]  cc:    Iva Boop, MD,FACG [facility] [address] [place], [place] [address]

## 2012-12-08 ENCOUNTER — Ambulatory Visit (INDEPENDENT_AMBULATORY_CARE_PROVIDER_SITE_OTHER): Payer: BC Managed Care – PPO

## 2012-12-08 DIAGNOSIS — Z23 Encounter for immunization: Secondary | ICD-10-CM

## 2012-12-09 ENCOUNTER — Encounter: Payer: Self-pay | Admitting: Internal Medicine

## 2012-12-12 ENCOUNTER — Ambulatory Visit (INDEPENDENT_AMBULATORY_CARE_PROVIDER_SITE_OTHER): Payer: BC Managed Care – PPO

## 2012-12-12 DIAGNOSIS — H6123 Impacted cerumen, bilateral: Secondary | ICD-10-CM

## 2012-12-12 DIAGNOSIS — H6120 Impacted cerumen, unspecified ear: Secondary | ICD-10-CM

## 2012-12-12 NOTE — Progress Notes (Signed)
Bilateral ears irrigation w/ water & peroxide. Liquid returned with ear wax. Bilateral ear canals clear. Pt tolerated well.

## 2012-12-16 ENCOUNTER — Other Ambulatory Visit: Payer: Self-pay | Admitting: Internal Medicine

## 2013-01-14 ENCOUNTER — Other Ambulatory Visit: Payer: Self-pay | Admitting: Internal Medicine

## 2013-03-18 ENCOUNTER — Inpatient Hospital Stay (HOSPITAL_COMMUNITY): Payer: BC Managed Care – PPO

## 2013-03-18 ENCOUNTER — Inpatient Hospital Stay (HOSPITAL_COMMUNITY)
Admission: EM | Admit: 2013-03-18 | Discharge: 2013-03-19 | DRG: 379 | Disposition: A | Discharge status: Home / Self Care | Payer: BC Managed Care – PPO | Attending: Internal Medicine | Admitting: Internal Medicine

## 2013-03-18 DIAGNOSIS — R079 Chest pain, unspecified: Secondary | ICD-10-CM

## 2013-03-18 DIAGNOSIS — E663 Overweight: Secondary | ICD-10-CM

## 2013-03-18 DIAGNOSIS — K5731 Diverticulosis of large intestine without perforation or abscess with bleeding: Principal | ICD-10-CM | POA: Diagnosis present

## 2013-03-18 DIAGNOSIS — M069 Rheumatoid arthritis, unspecified: Secondary | ICD-10-CM

## 2013-03-18 DIAGNOSIS — G4730 Sleep apnea, unspecified: Secondary | ICD-10-CM | POA: Diagnosis present

## 2013-03-18 DIAGNOSIS — G4733 Obstructive sleep apnea (adult) (pediatric): Secondary | ICD-10-CM

## 2013-03-18 DIAGNOSIS — I129 Hypertensive chronic kidney disease with stage 1 through stage 4 chronic kidney disease, or unspecified chronic kidney disease: Secondary | ICD-10-CM | POA: Diagnosis present

## 2013-03-18 DIAGNOSIS — IMO0001 Reserved for inherently not codable concepts without codable children: Secondary | ICD-10-CM

## 2013-03-18 DIAGNOSIS — K279 Peptic ulcer, site unspecified, unspecified as acute or chronic, without hemorrhage or perforation: Secondary | ICD-10-CM

## 2013-03-18 DIAGNOSIS — Z0389 Encounter for observation for other suspected diseases and conditions ruled out: Secondary | ICD-10-CM

## 2013-03-18 DIAGNOSIS — K625 Hemorrhage of anus and rectum: Secondary | ICD-10-CM

## 2013-03-18 DIAGNOSIS — Z860100 Personal history of colon polyps, unspecified: Secondary | ICD-10-CM

## 2013-03-18 DIAGNOSIS — K5730 Diverticulosis of large intestine without perforation or abscess without bleeding: Secondary | ICD-10-CM

## 2013-03-18 DIAGNOSIS — K922 Gastrointestinal hemorrhage, unspecified: Secondary | ICD-10-CM

## 2013-03-18 DIAGNOSIS — Z9089 Acquired absence of other organs: Secondary | ICD-10-CM

## 2013-03-18 DIAGNOSIS — H9210 Otorrhea, unspecified ear: Secondary | ICD-10-CM

## 2013-03-18 DIAGNOSIS — K219 Gastro-esophageal reflux disease without esophagitis: Secondary | ICD-10-CM

## 2013-03-18 DIAGNOSIS — I10 Essential (primary) hypertension: Secondary | ICD-10-CM

## 2013-03-18 DIAGNOSIS — E785 Hyperlipidemia, unspecified: Secondary | ICD-10-CM

## 2013-03-18 DIAGNOSIS — Z87442 Personal history of urinary calculi: Secondary | ICD-10-CM

## 2013-03-18 DIAGNOSIS — Z87891 Personal history of nicotine dependence: Secondary | ICD-10-CM

## 2013-03-18 DIAGNOSIS — N390 Urinary tract infection, site not specified: Secondary | ICD-10-CM

## 2013-03-18 DIAGNOSIS — R1084 Generalized abdominal pain: Secondary | ICD-10-CM

## 2013-03-18 DIAGNOSIS — E119 Type 2 diabetes mellitus without complications: Secondary | ICD-10-CM

## 2013-03-18 DIAGNOSIS — Z0000 Encounter for general adult medical examination without abnormal findings: Secondary | ICD-10-CM

## 2013-03-18 DIAGNOSIS — N189 Chronic kidney disease, unspecified: Secondary | ICD-10-CM

## 2013-03-18 LAB — CBC WITH DIFFERENTIAL/PLATELET
Basophils Absolute: 0 10*3/uL (ref 0.0–0.1)
Basophils Relative: 0 % (ref 0–1)
Eosinophils Absolute: 0.2 10*3/uL (ref 0.0–0.7)
MCH: 32.6 pg (ref 26.0–34.0)
MCHC: 34 g/dL (ref 30.0–36.0)
Neutro Abs: 4.7 10*3/uL (ref 1.7–7.7)
Neutrophils Relative %: 60 % (ref 43–77)
Platelets: 184 10*3/uL (ref 150–400)
RDW: 14 % (ref 11.5–15.5)

## 2013-03-18 LAB — COMPREHENSIVE METABOLIC PANEL
AST: 33 U/L (ref 0–37)
Albumin: 4.2 g/dL (ref 3.5–5.2)
Alkaline Phosphatase: 119 U/L — ABNORMAL HIGH (ref 39–117)
Chloride: 103 mEq/L (ref 96–112)
Potassium: 4 mEq/L (ref 3.7–5.3)
Total Bilirubin: 0.4 mg/dL (ref 0.3–1.2)
Total Protein: 7 g/dL (ref 6.0–8.3)

## 2013-03-18 LAB — HEMOGLOBIN AND HEMATOCRIT, BLOOD: HCT: 35.5 % — ABNORMAL LOW (ref 39.0–52.0)

## 2013-03-18 MED ORDER — ACETAMINOPHEN 325 MG PO TABS: 650.0000 mg | ORAL_TABLET | Freq: Four times a day (QID) | ORAL | Status: DC | PRN

## 2013-03-18 MED ORDER — DILTIAZEM HCL ER COATED BEADS 300 MG PO CP24
300.0000 mg | ORAL_CAPSULE | Freq: Every morning | ORAL | Status: DC
  Administered 2013-03-19: 300 mg via ORAL
  Filled 2013-03-18: qty 1

## 2013-03-18 MED ORDER — SODIUM CHLORIDE 0.9 % IJ SOLN
3.0000 mL | Freq: Two times a day (BID) | INTRAMUSCULAR | Status: DC
  Administered 2013-03-18: 23:00:00 3 mL via INTRAVENOUS

## 2013-03-18 MED ORDER — IOHEXOL 300 MG/ML  SOLN: 50.0000 mL | Freq: Once | INTRAMUSCULAR | Status: AC | PRN

## 2013-03-18 MED ORDER — SODIUM CHLORIDE 0.9 % IV SOLN
INTRAVENOUS | Status: DC
  Administered 2013-03-18: 23:00:00 via INTRAVENOUS

## 2013-03-18 MED ORDER — HYDROCODONE-ACETAMINOPHEN 5-325 MG PO TABS: 1.0000 | ORAL_TABLET | ORAL | Status: DC | PRN

## 2013-03-18 MED ORDER — ONDANSETRON HCL 4 MG PO TABS: 4.0000 mg | ORAL_TABLET | Freq: Four times a day (QID) | ORAL | Status: DC | PRN

## 2013-03-18 MED ORDER — MORPHINE SULFATE 2 MG/ML IJ SOLN: 1.0000 mg | INTRAMUSCULAR | Status: DC | PRN

## 2013-03-18 MED ORDER — FUROSEMIDE 40 MG PO TABS
40.0000 mg | ORAL_TABLET | Freq: Every morning | ORAL | Status: DC
  Administered 2013-03-19: 40 mg via ORAL
  Filled 2013-03-18: qty 1

## 2013-03-18 MED ORDER — ZOLPIDEM TARTRATE 5 MG PO TABS: 5.0000 mg | ORAL_TABLET | Freq: Every evening | ORAL | Status: DC | PRN

## 2013-03-18 MED ORDER — PANTOPRAZOLE SODIUM 40 MG IV SOLR
40.0000 mg | Freq: Two times a day (BID) | INTRAVENOUS | Status: DC
  Administered 2013-03-18 – 2013-03-19 (×2): 40 mg via INTRAVENOUS
  Filled 2013-03-18 (×3): qty 40

## 2013-03-18 MED ORDER — ONDANSETRON HCL 4 MG/2ML IJ SOLN: 4.0000 mg | Freq: Four times a day (QID) | INTRAMUSCULAR | Status: DC | PRN

## 2013-03-18 MED ORDER — ACETAMINOPHEN 650 MG RE SUPP: 650.0000 mg | Freq: Four times a day (QID) | RECTAL | Status: DC | PRN
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
## 2013-03-18 NOTE — ED Notes (Signed)
Family at bedside. 
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
## 2013-03-18 NOTE — ED Notes (Addendum)
Pt states that today approx. 45 mins ago he realized he was having rectal bleeding. Hx of same approx. 3 times before. Diverticulosis hx and pt reports hx of ruptured colon. Alert and oriented at this time. Pt bleeding through pants at this time.

## 2013-03-18 NOTE — ED Notes (Signed)
MD at bedside. 

## 2013-03-18 NOTE — ED Notes (Signed)
Patient denies pain and is resting comfortably.  

## 2013-03-18 NOTE — Progress Notes (Signed)
Utilization Review completed.  Mandeep Kiser RN CM  

## 2013-03-19 ENCOUNTER — Encounter (HOSPITAL_COMMUNITY): Payer: Self-pay | Admitting: *Deleted

## 2013-03-19 LAB — HEMOGLOBIN AND HEMATOCRIT, BLOOD
HCT: 36.5 % — ABNORMAL LOW (ref 39.0–52.0)
HCT: 37 % — ABNORMAL LOW (ref 39.0–52.0)
Hemoglobin: 12.1 g/dL — ABNORMAL LOW (ref 13.0–17.0)
Hemoglobin: 12.6 g/dL — ABNORMAL LOW (ref 13.0–17.0)

## 2013-03-19 LAB — GLUCOSE, CAPILLARY
GLUCOSE-CAPILLARY: 87 mg/dL (ref 70–99)
GLUCOSE-CAPILLARY: 87 mg/dL (ref 70–99)

## 2013-03-19 NOTE — Progress Notes (Signed)
Pt refused to have the CT scan of abd and pelvis done. Will cont to monitor.

## 2013-03-19 NOTE — Discharge Summary (Signed)
Physician Discharge Summary  Jon Hayden:[id number] DOB: 1940/09/24 DOA: 03/18/2013  PCP: Adella Hare, MD  Admit date: 03/18/2013 Discharge date: 03/19/2013  Recommendations for Outpatient Follow-up:  1. Follow up with gastroenterology in 4 weeks 2. Follow up with primary care doctor for repeat CBC and BMP in 1-2 weeks.  If still anemic, consider iron studies or starting empiric iron supplements.  3. Advised to stop using NSAIDS 4. Advised to stop niacin  Discharge Diagnoses:  Principal Problem:   DIVERTICULOSIS, COLON Active Problems:   Rectal bleeding   GI bleed   Discharge Condition: stable, improved  Diet recommendation: healthy heart  Wt Readings from Last 3 Encounters:  03/18/13 131.226 kg (289 lb 4.8 oz)  10/07/12 130.8 kg (288 lb 5.8 oz)  08/18/12 125.646 kg (277 lb)    History of present illness:  Jon Hayden is a 73 y.o. male, presenting today with multiple episodes of bright red blood per rectum. The patient has a long history of rectal bleeding and had a diverticular bleed in 2011 that necessitated laparotomy and placement of a colostomy, that was reversed. In the last 1-1/2 years, the patient has had 3 episodes of diverticular bleed that resolved without any surgical intervention. The patient denies any chest pains, shortness of breath, nausea or vomiting. He has a history of rheumatoid arthritis and takes nonsteroidals when necessary and according to him rarely , however the patient is on aspirin daily.   Hospital Course:   Bright red blood per rectum, likely a diverticular bleed. He had some bright red blood initially and then he came slightly darker and more coagulated. He has not had any blood per rectum in the last 12 hours. His hemoglobin remained stable around 12-1/2 mg/dL.  He is aware that he should continue his daily aspirin diet and not take nonsteroidal pain medication which can increase his risk of bleeding.  Recommend that he use stool softeners  as needed to keep his stools soft and regular to avoid constipation.  Recommended he normally eat a high fiber diet.  Elevated creatinine without AKI, currently 1.62 and baseline around 1.2-1.3.  Likely mildly prerenal due to GIB.  Given IVF and recommended that he drink plenty of fluids when he gets home.  Repeat CBC and BMP in 1 week.    As there is no mortality benefit to the use of niacin in the setting of statin use, will discontinue his niacin.   The rest of his medical problems remain stable.    Procedures:  None  Consultations:  Spoke with GI by phone  Discharge Exam: Filed Vitals:   03/18/13 2140  BP: 156/65  Pulse: 63  Temp: 98.5 F (36.9 C)  Resp: 16   Filed Vitals:   03/18/13 2140  BP: 156/65  Pulse: 63  Temp: 98.5 F (36.9 C)  TempSrc: Oral  Resp: 16  Height: 6\' 3"  (1.905 m)  Weight: 131.226 kg (289 lb 4.8 oz)  SpO2: 97%    General: CM, NAD HEENT:  MMM, NCAT Cardiovascular: RRR, no mrg, 2+ pulses Respiratory: CTAB ABD:  NABS, soft, ND/NT MSK:  No LEE  Discharge Instructions      Discharge Orders   Future Orders Complete By Expires   Call MD for:  difficulty breathing, headache or visual disturbances  As directed    Call MD for:  extreme fatigue  As directed    Call MD for:  hives  As directed    Call MD for:  persistant dizziness or  light-headedness  As directed    Call MD for:  persistant nausea and vomiting  As directed    Call MD for:  severe uncontrolled pain  As directed    Call MD for:  temperature >100.4  As directed    Diet - low sodium heart healthy  As directed    Diet Carb Modified  As directed    Discharge instructions  As directed    Comments:     You were hospitalized with a diverticular bleed which has stopped.  Your hemoglobin is 12.6mg /dl and stable.  Please stop taking your piroxicam and avoid ibuprofen, naprosyn/aleve, goody's powders, as these can increase your risk of bleeding.  Please continue to take your aspirin 81mg   daily, however.  You were slightly dehydrated when you were admitted, so you received some IV fluids, however, it is okay to resume your lasix as long as you have not had more bleeding.  Finally, there is no mortality benefit to using niacin if you are already taking a statin medication such as atorvastatin, so you may stop this medication.  Please return to the hospital if your bleeding recurs.   Increase activity slowly  As directed        Medication List    STOP taking these medications       OVER THE COUNTER MEDICATION     piroxicam 20 MG capsule  Commonly known as:  FELDENE      TAKE these medications       acetaminophen 500 MG tablet  Commonly known as:  TYLENOL  Take 1,000 mg by mouth every 6 (six) hours as needed (For arthritic pain.).     aspirin EC 81 MG tablet  Take 81 mg by mouth every morning.     atorvastatin 20 MG tablet  Commonly known as:  LIPITOR  Take 20 mg by mouth daily with supper.     cholecalciferol 1000 UNITS tablet  Commonly known as:  VITAMIN D  Take 1,000 Units by mouth every morning.     diltiazem 300 MG 24 hr capsule  Commonly known as:  CARDIZEM CD  Take 300 mg by mouth every morning.     esomeprazole 40 MG capsule  Commonly known as:  NEXIUM  Take 1 capsule (40 mg total) by mouth daily before breakfast.     fish oil-omega-3 fatty acids 1000 MG capsule  Take 1 g by mouth daily with supper.     furosemide 40 MG tablet  Commonly known as:  LASIX  Take 40 mg by mouth every morning.     hydroxychloroquine 200 MG tablet  Commonly known as:  PLAQUENIL  Take one tablet by mouth twice daily     metFORMIN 500 MG tablet  Commonly known as:  GLUCOPHAGE  Take 1 tablet (500 mg total) by mouth 2 (two) times daily with a meal.     multivitamin with minerals Tabs tablet  Take 1 tablet by mouth every morning.     pioglitazone 30 MG tablet  Commonly known as:  ACTOS  Take 30 mg by mouth every morning.     PRESCRIPTION MEDICATION  Place 1  drop into both eyes at bedtime. Patient unaware of the name of the eye drop however, he was taking travatan but stated this med is much cheaper     telmisartan 80 MG tablet  Commonly known as:  MICARDIS  Take 80 mg by mouth every morning.     vitamin C 1000 MG tablet  Take 1,000 mg by mouth every morning.       Follow-up Information   Follow up with Adella Hare, MD. Schedule an appointment as soon as possible for a visit in 2 weeks.   Specialty:  Internal Medicine   Contact information:   [address] [place] [address] [phone]       Schedule an appointment as soon as possible for a visit with Delfin Edis, MD.   Specialty:  Gastroenterology   Contact information:   [address] [place] [address] [phone]        The results of significant diagnostics from this hospitalization (including imaging, microbiology, ancillary and laboratory) are listed below for reference.    Significant Diagnostic Studies: No results found.  Microbiology: No results found for this or any previous visit (from the past 240 hour(s)).   Labs: Basic Metabolic Panel:  Recent Labs Lab 03/18/13 1830  NA 140  K 4.0  CL 103  CO2 26  GLUCOSE 117*  BUN 26*  CREATININE 1.62*  CALCIUM 9.6   Liver Function Tests:  Recent Labs Lab 03/18/13 1830  AST 33  ALT 41  ALKPHOS 119*  BILITOT 0.4  PROT 7.0  ALBUMIN 4.2   No results found for this basename: LIPASE, AMYLASE,  in the last 168 hours No results found for this basename: AMMONIA,  in the last 168 hours CBC:  Recent Labs Lab 03/18/13 1830 03/18/13 2220 03/19/13 0345 03/19/13 0935  WBC 7.8  --   --   --   NEUTROABS 4.7  --   --   --   HGB 12.8* 12.1* 12.1* 12.6*  HCT 37.6* 35.5* 36.5* 37.0*  MCV 95.7  --   --   --   PLT 184  --   --   --    Cardiac Enzymes: No results found for this basename: CKTOTAL, CKMB, CKMBINDEX, TROPONINI,  in the last 168 hours BNP: BNP (last 3 results) No results found  for this basename: PROBNP,  in the last 8760 hours CBG:  Recent Labs Lab 03/19/13 0756  GLUCAP 87    Time coordinating discharge: 45 minutes  Signed:  Iyad Deroo  [facility] 03/19/2013, 10:59 AM

## 2013-03-19 NOTE — Plan of Care (Signed)
Problem: Consults Goal: General Medical Patient Education See Patient Education Module for specific education. Outcome: Progressing Pt instructed on routine procedures for the floor. Instructed on diet orders, activity orders and procedural orders such as ct scans.

## 2013-03-22 LAB — TYPE AND SCREEN
ABO/RH(D): O NEG
Antibody Screen: NEGATIVE
Unit division: 0
Unit division: 0

## 2013-04-20 ENCOUNTER — Ambulatory Visit (INDEPENDENT_AMBULATORY_CARE_PROVIDER_SITE_OTHER): Payer: BC Managed Care – PPO | Admitting: Internal Medicine

## 2013-04-20 ENCOUNTER — Encounter: Payer: Self-pay | Admitting: Internal Medicine

## 2013-04-20 VITALS — BP 144/70 | HR 61 | Temp 97.9°F | Wt 280.0 lb

## 2013-04-20 DIAGNOSIS — K922 Gastrointestinal hemorrhage, unspecified: Secondary | ICD-10-CM

## 2013-04-20 DIAGNOSIS — M069 Rheumatoid arthritis, unspecified: Secondary | ICD-10-CM

## 2013-04-20 NOTE — Assessment & Plan Note (Signed)
Jon Hayden developed a spontaneous olecranon effusion. Denies any injury. Has not had any pain. The effusion decreased 50% between the time he called for appointment and his exam.   Plan Watchful waiting.

## 2013-04-20 NOTE — Assessment & Plan Note (Signed)
History of diverticular bleeds. Last episode, 24-48 hrs with overnight hospital stay Jan '15
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
## 2013-04-20 NOTE — Progress Notes (Signed)
Pre visit review using our clinic review tool, if applicable. No additional management support is needed unless otherwise documented below in the visit note. 

## 2013-05-25 ENCOUNTER — Encounter: Payer: Self-pay | Admitting: Internal Medicine

## 2013-05-25 ENCOUNTER — Telehealth: Payer: Self-pay | Admitting: Internal Medicine

## 2013-05-25 NOTE — Telephone Encounter (Signed)
Spoke with patient and he states that this AM after breakfast, he started having a diverticular bleed. States it is dark in color with clots about silver dollar size. He is asking if he needs to go to ED now or can he wait a little bit to see if it stops because last time this happened he went to ED and after a few hours it stopped and he was sent home. Spoke with Alonza Bogus, PA and she says he can wait an hour but if it picks up must go to ED.

## 2013-06-24 ENCOUNTER — Other Ambulatory Visit: Payer: Self-pay

## 2013-06-24 MED ORDER — TELMISARTAN 80 MG PO TABS: 80.0000 mg | ORAL_TABLET | Freq: Every morning | ORAL | Status: DC

## 2013-06-30 ENCOUNTER — Encounter: Payer: Self-pay | Admitting: Internal Medicine

## 2013-06-30 ENCOUNTER — Ambulatory Visit (INDEPENDENT_AMBULATORY_CARE_PROVIDER_SITE_OTHER): Payer: BC Managed Care – PPO | Admitting: Internal Medicine

## 2013-06-30 DIAGNOSIS — E119 Type 2 diabetes mellitus without complications: Secondary | ICD-10-CM

## 2013-06-30 DIAGNOSIS — M6788 Other specified disorders of synovium and tendon, other site: Secondary | ICD-10-CM

## 2013-06-30 DIAGNOSIS — I10 Essential (primary) hypertension: Secondary | ICD-10-CM

## 2013-06-30 DIAGNOSIS — M7660 Achilles tendinitis, unspecified leg: Secondary | ICD-10-CM

## 2013-06-30 DIAGNOSIS — N320 Bladder-neck obstruction: Secondary | ICD-10-CM

## 2013-06-30 DIAGNOSIS — E785 Hyperlipidemia, unspecified: Secondary | ICD-10-CM

## 2013-06-30 DIAGNOSIS — R0609 Other forms of dyspnea: Secondary | ICD-10-CM

## 2013-06-30 DIAGNOSIS — R0600 Dyspnea, unspecified: Secondary | ICD-10-CM

## 2013-06-30 DIAGNOSIS — R0989 Other specified symptoms and signs involving the circulatory and respiratory systems: Secondary | ICD-10-CM

## 2013-06-30 DIAGNOSIS — R0602 Shortness of breath: Secondary | ICD-10-CM

## 2013-06-30 MED ORDER — METHYLPREDNISOLONE ACETATE 80 MG/ML IJ SUSP
80.0000 mg | Freq: Once | INTRAMUSCULAR | Status: AC
  Administered 2013-06-30: 80 mg via INTRAMUSCULAR

## 2013-06-30 NOTE — Assessment & Plan Note (Signed)
stable overall by history and exam, recent data reviewed with pt, and pt to continue medical treatment as before,  to f/u any worsening symptoms or concerns BP Readings from Last 3 Encounters:  04/20/13 144/70  03/18/13 156/65  10/08/12 139/71

## 2013-06-30 NOTE — Assessment & Plan Note (Signed)
Also for psa as he is due 

## 2013-06-30 NOTE — Patient Instructions (Signed)
Your EKG was OK today  You had the steroid shot today  Please continue all other medications as before, and refills have been done if requested. Please have the pharmacy call with any other refills you may need.  Please continue your efforts at being more active, low cholesterol diet, and weight control.  You will be contacted regarding the referral for: Dr Smith/sports medicine  Please keep your appointments with your specialists as you may have planned  Please go to the LAB in the Basement (turn left off the elevator) for the tests to be done tomorrow  You will be contacted by phone if any changes need to be made immediately.  Otherwise, you will receive a letter about your results with an explanation, but please check with MyChart first.  Please remember to sign up for MyChart if you have not done so, as this will be important to you in the future with finding out test results, communicating by private email, and scheduling acute appointments online when needed.  Please return in 6 months, or sooner if needed

## 2013-06-30 NOTE — Assessment & Plan Note (Addendum)
ECG reviewed as per emr, exam o/w benign,  to f/u any worsening symptoms or concerns, pt declines cxr or other eval for now  Note:  Total time for pt hx, exam, review of record with pt in the room, determination of diagnoses and plan for further eval and tx is > 40 min, with over 50% spent in coordination and counseling of patient

## 2013-06-30 NOTE — Assessment & Plan Note (Signed)
,  stable overall by history and exam, recent data reviewed with pt, and pt to continue medical treatment as before,  to f/u any worsening symptoms or concerns Lab Results  Component Value Date   HGBA1C 5.9* 10/07/2012

## 2013-06-30 NOTE — Progress Notes (Signed)
Pre visit review using our clinic review tool, if applicable. No additional management support is needed unless otherwise documented below in the visit note. 

## 2013-06-30 NOTE — Assessment & Plan Note (Signed)
stable overall by history and exam, recent data reviewed with pt, and pt to continue medical treatment as before,  to f/u any worsening symptoms or concerns Lab Results  Component Value Date   LDLCALC 52 10/08/2012
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
## 2013-07-01 ENCOUNTER — Ambulatory Visit: Payer: Medicare Other | Admitting: Internal Medicine

## 2013-07-01 ENCOUNTER — Other Ambulatory Visit (INDEPENDENT_AMBULATORY_CARE_PROVIDER_SITE_OTHER): Payer: BC Managed Care – PPO

## 2013-07-01 DIAGNOSIS — E119 Type 2 diabetes mellitus without complications: Secondary | ICD-10-CM

## 2013-07-01 DIAGNOSIS — N320 Bladder-neck obstruction: Secondary | ICD-10-CM

## 2013-07-01 LAB — HEPATIC FUNCTION PANEL
ALK PHOS: 94 U/L (ref 39–117)
ALT: 25 U/L (ref 0–53)
AST: 21 U/L (ref 0–37)
Albumin: 3.8 g/dL (ref 3.5–5.2)
BILIRUBIN DIRECT: 0.1 mg/dL (ref 0.0–0.3)
Total Bilirubin: 0.5 mg/dL (ref 0.3–1.2)
Total Protein: 6.3 g/dL (ref 6.0–8.3)

## 2013-07-01 LAB — URINALYSIS, ROUTINE W REFLEX MICROSCOPIC
Bilirubin Urine: NEGATIVE
Hgb urine dipstick: NEGATIVE
Leukocytes, UA: NEGATIVE
Nitrite: NEGATIVE
SPECIFIC GRAVITY, URINE: 1.025 (ref 1.000–1.030)
Total Protein, Urine: NEGATIVE
URINE GLUCOSE: NEGATIVE
Urobilinogen, UA: 0.2 (ref 0.0–1.0)
pH: 6.5 (ref 5.0–8.0)

## 2013-07-01 LAB — CBC WITH DIFFERENTIAL/PLATELET
Basophils Absolute: 0 10*3/uL (ref 0.0–0.1)
Basophils Relative: 0.4 % (ref 0.0–3.0)
EOS ABS: 0.2 10*3/uL (ref 0.0–0.7)
Eosinophils Relative: 2.9 % (ref 0.0–5.0)
HCT: 39.5 % (ref 39.0–52.0)
HEMOGLOBIN: 13.2 g/dL (ref 13.0–17.0)
Lymphocytes Relative: 18.4 % (ref 12.0–46.0)
Lymphs Abs: 1.5 10*3/uL (ref 0.7–4.0)
MCHC: 33.3 g/dL (ref 30.0–36.0)
MCV: 97.2 fl (ref 78.0–100.0)
MONOS PCT: 6.7 % (ref 3.0–12.0)
Monocytes Absolute: 0.6 10*3/uL (ref 0.1–1.0)
NEUTROS ABS: 5.9 10*3/uL (ref 1.4–7.7)
Neutrophils Relative %: 71.6 % (ref 43.0–77.0)
Platelets: 189 10*3/uL (ref 150.0–400.0)
RBC: 4.07 Mil/uL — ABNORMAL LOW (ref 4.22–5.81)
RDW: 14.1 % (ref 11.5–14.6)
WBC: 8.3 10*3/uL (ref 4.5–10.5)

## 2013-07-01 LAB — MICROALBUMIN / CREATININE URINE RATIO
Creatinine,U: 195.7 mg/dL
Microalb Creat Ratio: 1 mg/g (ref 0.0–30.0)
Microalb, Ur: 2 mg/dL — ABNORMAL HIGH (ref 0.0–1.9)

## 2013-07-01 LAB — BASIC METABOLIC PANEL
BUN: 31 mg/dL — ABNORMAL HIGH (ref 6–23)
CALCIUM: 9.4 mg/dL (ref 8.4–10.5)
CO2: 26 mEq/L (ref 19–32)
CREATININE: 1.7 mg/dL — AB (ref 0.4–1.5)
Chloride: 106 mEq/L (ref 96–112)
GFR: 41.01 mL/min — AB (ref >60.00)
Glucose, Bld: 106 mg/dL — ABNORMAL HIGH (ref 70–99)
Potassium: 4.8 mEq/L (ref 3.5–5.1)
SODIUM: 139 meq/L (ref 135–145)

## 2013-07-01 LAB — LIPID PANEL
CHOL/HDL RATIO: 3
Cholesterol: 106 mg/dL (ref 0–200)
HDL: 38.1 mg/dL — AB (ref >39.00)
LDL CALC: 57 mg/dL (ref 0–99)
TRIGLYCERIDES: 55 mg/dL (ref 0.0–149.0)
VLDL: 11 mg/dL (ref 0.0–40.0)

## 2013-07-01 LAB — PSA: PSA: 0.59 ng/mL (ref 0.10–4.00)

## 2013-07-01 LAB — HEMOGLOBIN A1C: Hgb A1c MFr Bld: 5.7 % (ref 4.6–6.5)

## 2013-07-01 LAB — TSH: TSH: 1.35 u[IU]/mL (ref 0.35–5.50)

## 2013-07-10 LAB — HM DIABETES EYE EXAM

## 2013-07-14 ENCOUNTER — Other Ambulatory Visit: Payer: Self-pay

## 2013-07-14 ENCOUNTER — Ambulatory Visit: Payer: Medicare Other | Admitting: Family Medicine

## 2013-07-14 MED ORDER — DILTIAZEM HCL ER COATED BEADS 300 MG PO CP24: 300.0000 mg | ORAL_CAPSULE | Freq: Every morning | ORAL | Status: AC

## 2013-07-14 MED ORDER — ATORVASTATIN CALCIUM 20 MG PO TABS: 20.0000 mg | ORAL_TABLET | Freq: Every day | ORAL | Status: AC

## 2013-07-14 MED ORDER — FUROSEMIDE 40 MG PO TABS
40.0000 mg | ORAL_TABLET | Freq: Every morning | ORAL | Status: DC
Start: 2013-07-14 — End: 2013-10-13

## 2013-07-14 NOTE — Addendum Note (Signed)
Addended by: Sharon Seller B on: 07/14/2013 11:42 AM   Modules accepted: Orders

## 2013-07-15 ENCOUNTER — Other Ambulatory Visit (INDEPENDENT_AMBULATORY_CARE_PROVIDER_SITE_OTHER): Payer: BC Managed Care – PPO

## 2013-07-15 ENCOUNTER — Encounter: Payer: Self-pay | Admitting: Family Medicine

## 2013-07-15 ENCOUNTER — Ambulatory Visit (INDEPENDENT_AMBULATORY_CARE_PROVIDER_SITE_OTHER): Payer: BC Managed Care – PPO | Admitting: Family Medicine

## 2013-07-15 VITALS — BP 132/76 | HR 67

## 2013-07-15 DIAGNOSIS — M79609 Pain in unspecified limb: Secondary | ICD-10-CM

## 2013-07-15 DIAGNOSIS — M79672 Pain in left foot: Secondary | ICD-10-CM

## 2013-07-15 DIAGNOSIS — M6520 Calcific tendinitis, unspecified site: Secondary | ICD-10-CM

## 2013-07-15 DIAGNOSIS — M6528 Calcific tendinitis, other site: Secondary | ICD-10-CM

## 2013-07-15 MED ORDER — DICLOFENAC SODIUM 2 % TD SOLN: 2.0000 "application " | Freq: Two times a day (BID) | TRANSDERMAL | Status: DC

## 2013-07-15 NOTE — Patient Instructions (Signed)
Good to meet you Ice 20 minutes 2 times a day pennsaid topically 2 times a day, pharmacy will be calling you.  Exercises try daily or most days Vitamin D 2000 IU daily.  Turmeric 500mg  twice daily  I want to see you again in 3 weeks.

## 2013-07-15 NOTE — Progress Notes (Signed)
[name] [place], [place] [address] Phone: [phone] Subjective:    I'm seeing this patient by the request  of:  Cathlean Cower, MD   CC: Heel pain  [id number] Jon Hayden is a 73 y.o. male coming in with complaint of her leg. Patient has had this pain for multiple months. Patient does not remember any true injury in seem to be more of an insidious onset. Patient does have a past medical history significant for inflammatory arthritis. Patient also has a history of peptic ulcer disease GI bleeding and renal insufficiency and is avoiding anti-inflammatories. Patient states that this pain is worse with any type of angulation now. We're starting to upper downstairs. Came costal aching pain even at rest sometimes. Patient has tried some over-the-counter medications with no significant benefit. Denies any numbness or radiation of pain seems localized to the posterior aspect of the heels. Patient's primary care provider and was concern for an Achilles tendinosis. Pain is 6/10 in severity.     Past medical history, social, surgical and family history all reviewed in electronic medical record.   Review of Systems: No headache, visual changes, nausea, vomiting, diarrhea, constipation, dizziness, abdominal pain, skin rash, fevers, chills, night sweats, weight loss, swollen lymph nodes, body aches, joint swelling, muscle aches, chest pain, shortness of breath, mood changes.   Objective Blood pressure 132/76, pulse 67, SpO2 94.00%.  General: No apparent distress alert and oriented x3 mood and affect normal, dressed appropriately.  HEENT: Pupils equal, extraocular movements intact  Respiratory: Patient's speak in full sentences and does not appear short of breath  Cardiovascular: No lower extremity edema, non tender, no erythema  Skin: Warm dry intact with no signs of infection or rash on extremities or on axial skeleton.  Abdomen: Soft nontender    Neuro: Cranial nerves II through XII are intact, neurovascularly intact in all extremities with 2+ DTRs and 2+ pulses.  Lymph: No lymphadenopathy of posterior or anterior cervical chain or axillae bilaterally.  Gait normal with good balance and coordination.  MSK:  Non tender with full range of motion and good stability and symmetric strength and tone of shoulders, elbows, wrist, hip, knees bilaterally.  Ankle: Bilateral True swelling bilaterally Range of motion is full in all directions. Mild restriction in plantar and dorsiflexion Strength is 5/5 in all directions. Stable lateral and medial ligaments; squeeze test and kleiger test unremarkable; Talar dome nontender; No pain at base of 5th MT; No tenderness over cuboid; No tenderness over N spot or navicular prominence No tenderness on posterior aspects of lateral and medial malleolus No sign of peroneal tendon subluxations or tenderness to palpation Patient is severely tender to palpation at the insertion of the Achilles tendon and does have a Haglund's nodule Negative tarsal tunnel tinel's Able to walk 4 steps.  MSK US performed of: Bilateral This study was ordered, performed, and interpreted by Charlann Boxer D.O.  Foot/Ankle:   All structures visualized.   Talar dome osteoarthritis  Ankle mortise without effusion. Peroneus longus and brevis tendons unremarkable on long and transverse views without sheath effusions. Posterior tibialis, flexor hallucis longus, and flexor digitorum longus tendons unremarkable on long and transverse views without sheath effusions. Achilles tendon does have hypoechoic changes as well as some intersubstance tearing at its insertion with calcific changes. Increasing Doppler flow noted Anterior Talofibular Ligament and Calcaneofibular Ligaments unremarkable and intact. Deltoid Ligament unremarkable and intact. Plantar fascia intact and without effusion, normal thickness. No increased  doppler signal, cap  sign, or thickening of tibial cortex.  IMPRESSION:  Osteoarthritis with calcific Achilles tendinitis     Impression and Recommendations:     This case required medical decision making of moderate complexity.

## 2013-07-15 NOTE — Assessment & Plan Note (Signed)
Patient does have calcific Achilles tendinitis bilaterally. Patient's past medical history this is going to be somewhat of a challenge to actually take care of. We discussed an icing protocol as well as he lists were added to her shoes bilaterally. Patient was given home exercise program and we will do a topical anti-inflammatory for a short course. Patient will try this and is warned the potential side effects including stomach discomfort. Patient will try this and come back again in 2-3 weeks for further evaluation. At that time we'll ultrasound area again to make sure that the calcium is being reabsorbed.

## 2013-07-16 ENCOUNTER — Encounter: Payer: Self-pay | Admitting: Family Medicine

## 2013-07-16 ENCOUNTER — Other Ambulatory Visit: Payer: Self-pay

## 2013-07-16 MED ORDER — PIOGLITAZONE HCL 30 MG PO TABS: 30.0000 mg | ORAL_TABLET | Freq: Every morning | ORAL | Status: DC

## 2013-07-21 ENCOUNTER — Other Ambulatory Visit: Payer: Self-pay

## 2013-07-21 MED ORDER — ESOMEPRAZOLE MAGNESIUM 40 MG PO CPDR: 40.0000 mg | DELAYED_RELEASE_CAPSULE | Freq: Every day | ORAL | Status: AC

## 2013-07-31 ENCOUNTER — Encounter: Payer: Self-pay | Admitting: Family Medicine

## 2013-08-04 ENCOUNTER — Ambulatory Visit (INDEPENDENT_AMBULATORY_CARE_PROVIDER_SITE_OTHER): Payer: BC Managed Care – PPO | Admitting: Family Medicine

## 2013-08-04 ENCOUNTER — Ambulatory Visit: Payer: BC Managed Care – PPO | Admitting: Family Medicine

## 2013-08-04 ENCOUNTER — Encounter: Payer: Self-pay | Admitting: Family Medicine

## 2013-08-04 VITALS — BP 130/70 | HR 64 | Ht 75.0 in | Wt 275.0 lb

## 2013-08-04 DIAGNOSIS — M6528 Calcific tendinitis, other site: Secondary | ICD-10-CM

## 2013-08-04 DIAGNOSIS — M6520 Calcific tendinitis, unspecified site: Secondary | ICD-10-CM

## 2013-08-04 NOTE — Patient Instructions (Signed)
Good to see you Your are doing great!!! Continue the pennsaid Ice only 1 time a day at end of day Continue vitamin D and turmeric Heel lift Continue exercises 3 times a week but not while you are at the beach.  See me again in 4 weeks.

## 2013-08-04 NOTE — Assessment & Plan Note (Signed)
Patient is improving slowly. Patient will continue to heal this as well as a home exercise program in the topical anti-inflammatories. We discussed the importance of the icing as well. This patient continues with conservative therapy I would like to continue with this. Patient has any setback we'll consider injection mostly: After the calcific areas and within the tendon sheath but would like to avoid if possible. Patient is unable to do oral anti-inflammatories and is not a candidate for the nitroglycerin. Patient will follow up again in 4 weeks for further evaluation and treatment.  Spent greater than 25 minutes with patient face-to-face and had greater than 50% of counseling including as described above in assessment and plan.

## 2013-08-04 NOTE — Progress Notes (Signed)
  [facility] [place], [place] [address] Phone: [phone] Subjective:    CC: Heel pain follow up  [id number] Jon Hayden is a 73 y.o. male coming in for followup of calcific Achilles tendinitis. Patient states that he is doing better about 50%. Patient states that he continues to wear the heel lifts and doing home exercises fairly regularly. Patient is also doing the icing. Patient has been doing the topical anti-inflammatory and denies any side effects. Overall patient still can't do stairs very regularly. Patient does have a trip to the beach coming up oncegoing to be able to walk on the sand.     Past medical history, social, surgical and family history all reviewed in electronic medical record.   Review of Systems: No headache, visual changes, nausea, vomiting, diarrhea, constipation, dizziness, abdominal pain, skin rash, fevers, chills, night sweats, weight loss, swollen lymph nodes, body aches, joint swelling, muscle aches, chest pain, shortness of breath, mood changes.   Objective Blood pressure 130/70, pulse 64, height 6\' 3"  (1.905 m), weight 275 lb (124.739 kg), SpO2 96.00%.  General: No apparent distress alert and oriented x3 mood and affect normal, dressed appropriately.  HEENT: Pupils equal, extraocular movements intact  Respiratory: Patient's speak in full sentences and does not appear short of breath  Cardiovascular: No lower extremity edema, non tender, no erythema  Skin: Warm dry intact with no signs of infection or rash on extremities or on axial skeleton.  Abdomen: Soft nontender  Neuro: Cranial nerves II through XII are intact, neurovascularly intact in all extremities with 2+ DTRs and 2+ pulses.  Lymph: No lymphadenopathy of posterior or anterior cervical chain or axillae bilaterally.  Gait normal with good balance and coordination.  MSK:  Non tender with full range of motion and good stability and symmetric strength  and tone of shoulders, elbows, wrist, hip, knees bilaterally.  Ankle: Bilateral Haglund's nodules present Range of motion is full in all directions. Strength is 5/5 in all directions. Stable lateral and medial ligaments; squeeze test and kleiger test unremarkable; Talar dome nontender; No pain at base of 5th MT; No tenderness over cuboid; No tenderness over N spot or navicular prominence No tenderness on posterior aspects of lateral and medial malleolus No sign of peroneal tendon subluxations or tenderness to palpation Patient is moderate tender to palpation at the insertion of the Achilles tendon and does have a Haglund's nodule but this is improved from previous exam Negative tarsal tunnel tinel's Able to walk 4 steps.  MSK US performed of: Bilateral This study was ordered, performed, and interpreted by Charlann Boxer D.O.  Foot/Ankle:   All structures visualized.   Talar dome osteoarthritis  Ankle mortise without effusion. Peroneus longus and brevis tendons unremarkable on long and transverse views without sheath effusions. Posterior tibialis, flexor hallucis longus, and flexor digitorum longus tendons unremarkable on long and transverse views without sheath effusions. Achilles tendon does have significant left hypoechoic changes as well as less calcific changes. Increasing Doppler flow noted Anterior Talofibular Ligament and Calcaneofibular Ligaments unremarkable and intact. Deltoid Ligament unremarkable and intact. Plantar fascia intact and without effusion, normal thickness. No increased doppler signal, cap sign, or thickening of tibial cortex.  IMPRESSION:  Osteoarthritis with calcific Achilles tendinitis but improvement noted.     Impression and Recommendations:     This case required medical decision making of moderate complexity.

## 2013-08-21 ENCOUNTER — Other Ambulatory Visit: Payer: Self-pay

## 2013-08-21 MED ORDER — METFORMIN HCL 500 MG PO TABS: 500.0000 mg | ORAL_TABLET | Freq: Two times a day (BID) | ORAL | Status: DC

## 2013-08-27 ENCOUNTER — Ambulatory Visit (INDEPENDENT_AMBULATORY_CARE_PROVIDER_SITE_OTHER): Payer: BC Managed Care – PPO | Admitting: Pulmonary Disease

## 2013-08-27 ENCOUNTER — Encounter: Payer: Self-pay | Admitting: Pulmonary Disease

## 2013-08-27 VITALS — BP 132/82 | HR 67 | Temp 98.0°F | Ht 75.0 in | Wt 274.0 lb

## 2013-08-27 DIAGNOSIS — G4733 Obstructive sleep apnea (adult) (pediatric): Secondary | ICD-10-CM

## 2013-08-27 NOTE — Progress Notes (Signed)
   Subjective:    Patient ID: Jon Hayden, male    DOB: [birth year], 73 y.o.   MRN: [id number]  HPI Patient comes in today for followup of his obstructive sleep apnea. He is been wearing his bilevel compliantly by the download, and has adequate control of his obstructive events on his current pressure setting. He feels that he is sleeping well with the device, and does not have significant daytime alertness issues. His only complaint is that of ongoing mask leaks, which is verified by his download.   Review of Systems  Constitutional: Negative for fever and unexpected weight change.  HENT: Positive for postnasal drip. Negative for congestion, dental problem, ear pain, nosebleeds, rhinorrhea, sinus pressure, sneezing, sore throat and trouble swallowing.   Eyes: Negative for redness and itching.  Respiratory: Positive for shortness of breath and wheezing. Negative for cough and chest tightness.   Cardiovascular: Negative for palpitations and leg swelling.  Gastrointestinal: Negative for nausea and vomiting.  Genitourinary: Negative for dysuria.  Musculoskeletal: Negative for joint swelling.  Skin: Negative for rash.  Neurological: Negative for headaches.  Hematological: Does not bruise/bleed easily.  Psychiatric/Behavioral: Negative for dysphoric mood. The patient is not nervous/anxious.        Objective:   Physical Exam Obese male in no acute distress Nose without purulence or discharge noted Neck without lymphadenopathy or thyromegaly No skin breakdown or pressure necrosis from the CPAP Lower extremities with edema noted, no cyanosis Alert and oriented, moves all 4 extremities. Does not appear to be overly sleepy.       Assessment & Plan:

## 2013-08-27 NOTE — Assessment & Plan Note (Signed)
She is doing very well on his bilevel device, and feels that it continues to benefit his sleep and daytime alertness. His download shows excellent compliance, and adequate treatment of his obstructive sleep apnea. He does have some leak, and we will need to get a better fitting mask at the sleep Center. I've also encouraged him to work aggressively on weight loss. He will see me back in one year.

## 2013-08-27 NOTE — Patient Instructions (Signed)
Continue with cpap. Will send you to the sleep center for a mask fitting. Work on weight loss followup with me again in one year if doing well, but call if you are having issues with cpap.

## 2013-09-01 ENCOUNTER — Other Ambulatory Visit (HOSPITAL_BASED_OUTPATIENT_CLINIC_OR_DEPARTMENT_OTHER): Payer: BC Managed Care – PPO

## 2013-09-03 ENCOUNTER — Encounter: Payer: Self-pay | Admitting: Family Medicine

## 2013-09-03 ENCOUNTER — Ambulatory Visit (INDEPENDENT_AMBULATORY_CARE_PROVIDER_SITE_OTHER): Payer: BC Managed Care – PPO | Admitting: Family Medicine

## 2013-09-03 VITALS — BP 144/82 | HR 68 | Ht 75.5 in | Wt 274.0 lb

## 2013-09-03 DIAGNOSIS — M6528 Calcific tendinitis, other site: Secondary | ICD-10-CM

## 2013-09-03 DIAGNOSIS — M6520 Calcific tendinitis, unspecified site: Secondary | ICD-10-CM

## 2013-09-03 NOTE — Patient Instructions (Signed)
Good to see you Ice still is your best friend.  Continue exercises 3 times a week Try the new brace See me when you need me.

## 2013-09-03 NOTE — Progress Notes (Signed)
[facility] [place], [place] [address] Phone: [phone] Subjective:    CC: Heel pain follow up  [id number] Jon Hayden is a 73 y.o. male coming in for followup of calcific Achilles tendinitis. Patient continues to say that he is improving. Patient states that he is about 80-90% better. Patient continues with the heel lifts. Patient continues to do the exercising as well as the icing. Patient was unable to do topical anti-inflammatories secondary to skin reaction. Patient denies any new symptoms     Past medical history, social, surgical and family history all reviewed in electronic medical record.   Review of Systems: No headache, visual changes, nausea, vomiting, diarrhea, constipation, dizziness, abdominal pain, skin rash, fevers, chills, night sweats, weight loss, swollen lymph nodes, body aches, joint swelling, muscle aches, chest pain, shortness of breath, mood changes.   Objective Blood pressure 144/82, pulse 68, height 6' 3.5" (1.918 m), weight 274 lb (124.286 kg), SpO2 97.00%.  General: No apparent distress alert and oriented x3 mood and affect normal, dressed appropriately.  HEENT: Pupils equal, extraocular movements intact  Respiratory: Patient's speak in full sentences and does not appear short of breath  Cardiovascular: No lower extremity edema, non tender, no erythema  Skin: Warm dry intact with no signs of infection or rash on extremities or on axial skeleton.  Abdomen: Soft nontender  Neuro: Cranial nerves II through XII are intact, neurovascularly intact in all extremities with 2+ DTRs and 2+ pulses.  Lymph: No lymphadenopathy of posterior or anterior cervical chain or axillae bilaterally.  Gait normal with good balance and coordination.  MSK:  Non tender with full range of motion and good stability and symmetric strength and tone of shoulders, elbows, wrist, hip, knees bilaterally.  Ankle: Bilateral Haglund's nodules  present Range of motion is full in all directions. Strength is 5/5 in all directions. Stable lateral and medial ligaments; squeeze test and kleiger test unremarkable; Talar dome nontender; No pain at base of 5th MT; No tenderness over cuboid; No tenderness over N spot or navicular prominence No tenderness on posterior aspects of lateral and medial malleolus No sign of peroneal tendon subluxations or tenderness to palpation Patient is moderate tender to palpation at the insertion of the Achilles tendon and does have a Haglund's nodule  Negative tarsal tunnel tinel's Able to walk 4 steps.  MSK US performed of: Bilateral This study was ordered, performed, and interpreted by Charlann Boxer D.O.  Foot/Ankle:   All structures visualized.   Talar dome osteoarthritis  Ankle mortise without effusion. Peroneus longus and brevis tendons unremarkable on long and transverse views without sheath effusions. Posterior tibialis, flexor hallucis longus, and flexor digitorum longus tendons unremarkable on long and transverse views without sheath effusions. Patient Achilles has significant decrease calcific changes from previous exam. Patient is to have mild hypoechoic changes. Deltoid Ligament unremarkable and intact. Plantar fascia intact and without effusion, normal thickness. No increased doppler signal, cap sign, or thickening of tibial cortex.  IMPRESSION:  Osteoarthritis with improving calcific Achilles tendinosis.     Impression and Recommendations:     This case required medical decision making of moderate complexity.   Calcific Achilles tendinitis Patient is doing better overall. Patient was placed in a pneumatic compression heel cup Patient will continue with the exercises as well as the icing protocol. We discussed other topical medications could be beneficial. Patient will continue with the heel lifts and the shoes if beneficial. Patient will come back again  in 4 weeks for further  evaluation.  Spent greater than 25 minutes with patient face-to-face and had greater than 50% of counseling including as described above in assessment and plan.

## 2013-09-03 NOTE — Assessment & Plan Note (Signed)
Patient is doing better overall. Patient was placed in a pneumatic compression heel cup Patient will continue with the exercises as well as the icing protocol. We discussed other topical medications could be beneficial. Patient will continue with the heel lifts and the shoes if beneficial. Patient will come back again in 4 weeks for further evaluation.

## 2013-09-08 ENCOUNTER — Ambulatory Visit (HOSPITAL_BASED_OUTPATIENT_CLINIC_OR_DEPARTMENT_OTHER): Payer: BC Managed Care – PPO | Attending: Pulmonary Disease | Admitting: Radiology

## 2013-09-08 DIAGNOSIS — Z9989 Dependence on other enabling machines and devices: Principal | ICD-10-CM

## 2013-09-08 DIAGNOSIS — G4733 Obstructive sleep apnea (adult) (pediatric): Secondary | ICD-10-CM

## 2013-09-15 ENCOUNTER — Telehealth: Payer: Self-pay | Admitting: Pulmonary Disease

## 2013-09-15 NOTE — Telephone Encounter (Signed)
lmtcb x1 at # provided

## 2013-09-16 NOTE — Telephone Encounter (Signed)
LMTC x 1 for Pattie

## 2013-09-16 NOTE — Telephone Encounter (Signed)
LMOM TCB x2 for [facility]

## 2013-09-16 NOTE — Telephone Encounter (Signed)
Jon Hayden is calling back [phone]

## 2013-09-21 NOTE — Telephone Encounter (Signed)
lmomtcb for [employer]

## 2013-09-22 NOTE — Telephone Encounter (Signed)
Pattie returned call.  Spoke with Pattie who reported that pt's rx for BiPAP was sent to the main fax on [phone] on 6.26.15 -- she was calling to check the status of this.  Asked Pattie to please fax this again to triage.  She will do so.   Once fax is received, will place in [place].  It can be faxed back to her directly at the number on the coversheet and she will call to verify receipt.  Will hold message in triage to await fax.

## 2013-09-22 NOTE — Telephone Encounter (Signed)
lmomtcb x 1  For pattie

## 2013-09-23 NOTE — Telephone Encounter (Signed)
Pt has been on bipap for at least 1 1/2 yrs.  I do not know why this form requires my signature.

## 2013-09-23 NOTE — Telephone Encounter (Signed)
Correction message left for Jon Hayden

## 2013-09-23 NOTE — Telephone Encounter (Signed)
lmtcb x1 for pt Fax left in triage

## 2013-09-23 NOTE — Telephone Encounter (Signed)
Fax has been received and placed in [facility] look at. Please advise thanks

## 2013-09-24 ENCOUNTER — Telehealth: Payer: Self-pay

## 2013-09-24 MED ORDER — HYDROXYCHLOROQUINE SULFATE 200 MG PO TABS: 200.0000 mg | ORAL_TABLET | Freq: Two times a day (BID) | ORAL | Status: DC

## 2013-09-24 NOTE — Telephone Encounter (Signed)
lmomtcb x1 for patti

## 2013-09-24 NOTE — Telephone Encounter (Signed)
Refill done.  

## 2013-09-24 NOTE — Telephone Encounter (Signed)
Jon Hayden returned call

## 2013-09-25 NOTE — Telephone Encounter (Signed)
lmomtcb x 2 for pattie

## 2013-09-28 NOTE — Telephone Encounter (Signed)
lmomtcb for pattie -  Will hold msg is in triage as it was closed by accident.

## 2013-09-29 ENCOUNTER — Telehealth: Payer: Self-pay | Admitting: Pulmonary Disease

## 2013-09-29 NOTE — Telephone Encounter (Signed)
See phone note 09/15/13: Kathee Delton, MD at 09/23/2013 4:18 PM     Status: Signed        Pt has been on bipap for at least 1 1/2 yrs. I do not know why this form requires my signature.    lmtcb x1

## 2013-09-30 NOTE — Telephone Encounter (Signed)
lmtcb

## 2013-10-01 NOTE — Telephone Encounter (Signed)
Jon Hayden is calling back again

## 2013-10-01 NOTE — Telephone Encounter (Signed)
lmtcb x1 for patti

## 2013-10-01 NOTE — Telephone Encounter (Signed)
Jon Hayden, Jon Hayden returned call & can be reached [phone].  [name]

## 2013-10-01 NOTE — Telephone Encounter (Signed)
lmtcb x1 for [employer]

## 2013-10-02 NOTE — Telephone Encounter (Signed)
lmtcb x2-advised pattie to call back if anything further needed as we have been calling back and forth since 09/15/13 phone note

## 2013-10-03 ENCOUNTER — Encounter: Payer: Self-pay | Admitting: Internal Medicine

## 2013-10-03 DIAGNOSIS — Z0000 Encounter for general adult medical examination without abnormal findings: Secondary | ICD-10-CM

## 2013-10-03 DIAGNOSIS — E1165 Type 2 diabetes mellitus with hyperglycemia: Secondary | ICD-10-CM

## 2013-10-03 DIAGNOSIS — IMO0001 Reserved for inherently not codable concepts without codable children: Secondary | ICD-10-CM

## 2013-10-05 ENCOUNTER — Encounter: Payer: Self-pay | Admitting: Internal Medicine

## 2013-10-06 ENCOUNTER — Telehealth: Payer: Self-pay | Admitting: Pulmonary Disease

## 2013-10-06 NOTE — Telephone Encounter (Signed)
Called spoke with pt. He reports he was told [facility] he was able for new machine. Paperwork placed in [place] green folder by [employer]. Please advise thanks

## 2013-10-06 NOTE — Telephone Encounter (Signed)
Paperwork is not in my green folder.

## 2013-10-06 NOTE — Telephone Encounter (Signed)
Called spoke with patti. She reports pt called them and requested a new BIPAP machine and supplies. This is why we received the form for [facility] to sign. Please advise Dr. Gwenette Greet if you are willing to sign form for this. thanks

## 2013-10-06 NOTE — Telephone Encounter (Addendum)
Pt states that [employer] has faxed over a request for replacement CPAP device for Dr Gwenette Greet to sign.  Per pt, states that he has spoken with someone recently at our office and was advised that this form was received and would be given to Dr Gwenette Greet to review/sign. Per telephone note 09/22/13, form was given to [place] continues on to 10/02/13 ---------    Kathee Delton, MD at 09/23/2013  4:18 PM    Pt has been on bipap for at least 1 1/2 yrs.  I do not know why this form requires my signature.           Inge Rise, CMA at 09/23/2013 10:33 AM     Fax has been received and placed in [facility] look at. Please advise thanks         Rinaldo Ratel, CMA at 09/22/2013  1:21 PM    Pattie returned call.  Spoke with Pattie who reported that pt's rx for BiPAP was sent to the main fax on [phone] on 6.26.15 -- she was calling to check the status of this. Asked Pattie to please fax this again to triage.  She will do so.    Once fax is received, will place in [place].  It can be faxed back to her directly at the number on the coversheet and she will call to verify receipt.  Will hold message in triage to await fax.   ---------  Called and LM with [facility] - Pattie [phone] Pt aware that we are waiting on call back from [facility]

## 2013-10-06 NOTE — Addendum Note (Signed)
Addended by: Biagio Borg on: 10/06/2013 12:50 PM   Modules accepted: Orders

## 2013-10-06 NOTE — Telephone Encounter (Signed)
i thought his bilevel device was only 1 1/74 yrs old.  If the dme is willing to replace, I am willing to sign form.  Not sure where it is currently.  Do not see in my folder.

## 2013-10-06 NOTE — Telephone Encounter (Signed)
Pt called back & askes to speak w/ a nurse.

## 2013-10-07 NOTE — Telephone Encounter (Signed)
This is now in KC's green folder.

## 2013-10-13 ENCOUNTER — Other Ambulatory Visit (INDEPENDENT_AMBULATORY_CARE_PROVIDER_SITE_OTHER): Payer: BC Managed Care – PPO

## 2013-10-13 ENCOUNTER — Ambulatory Visit (INDEPENDENT_AMBULATORY_CARE_PROVIDER_SITE_OTHER): Payer: BC Managed Care – PPO | Admitting: Internal Medicine

## 2013-10-13 ENCOUNTER — Encounter: Payer: Self-pay | Admitting: Internal Medicine

## 2013-10-13 VITALS — BP 130/68 | HR 62 | Temp 98.2°F | Ht 75.5 in | Wt 275.0 lb

## 2013-10-13 DIAGNOSIS — H9193 Unspecified hearing loss, bilateral: Secondary | ICD-10-CM | POA: Insufficient documentation

## 2013-10-13 DIAGNOSIS — Z0000 Encounter for general adult medical examination without abnormal findings: Secondary | ICD-10-CM

## 2013-10-13 DIAGNOSIS — Z23 Encounter for immunization: Secondary | ICD-10-CM

## 2013-10-13 DIAGNOSIS — R972 Elevated prostate specific antigen [PSA]: Secondary | ICD-10-CM | POA: Insufficient documentation

## 2013-10-13 DIAGNOSIS — J449 Chronic obstructive pulmonary disease, unspecified: Secondary | ICD-10-CM

## 2013-10-13 DIAGNOSIS — IMO0001 Reserved for inherently not codable concepts without codable children: Secondary | ICD-10-CM

## 2013-10-13 DIAGNOSIS — G609 Hereditary and idiopathic neuropathy, unspecified: Secondary | ICD-10-CM

## 2013-10-13 DIAGNOSIS — E1165 Type 2 diabetes mellitus with hyperglycemia: Secondary | ICD-10-CM

## 2013-10-13 DIAGNOSIS — K922 Gastrointestinal hemorrhage, unspecified: Secondary | ICD-10-CM

## 2013-10-13 DIAGNOSIS — E119 Type 2 diabetes mellitus without complications: Secondary | ICD-10-CM

## 2013-10-13 DIAGNOSIS — H9190 Unspecified hearing loss, unspecified ear: Secondary | ICD-10-CM

## 2013-10-13 HISTORY — DX: Elevated prostate specific antigen (PSA): R97.20

## 2013-10-13 HISTORY — DX: Chronic obstructive pulmonary disease, unspecified: J44.9

## 2013-10-13 LAB — BASIC METABOLIC PANEL
BUN: 27 mg/dL — ABNORMAL HIGH (ref 6–23)
CALCIUM: 9.5 mg/dL (ref 8.4–10.5)
CO2: 29 mEq/L (ref 19–32)
Chloride: 105 mEq/L (ref 96–112)
Creatinine, Ser: 1.6 mg/dL — ABNORMAL HIGH (ref 0.4–1.5)
GFR: 46.83 mL/min — ABNORMAL LOW (ref >60.00)
GLUCOSE: 104 mg/dL — AB (ref 70–99)
POTASSIUM: 4.6 meq/L (ref 3.5–5.1)
SODIUM: 140 meq/L (ref 135–145)

## 2013-10-13 LAB — LIPID PANEL
CHOLESTEROL: 106 mg/dL (ref 0–200)
HDL: 36.3 mg/dL — ABNORMAL LOW (ref >39.00)
LDL Cholesterol: 60 mg/dL (ref 0–99)
NONHDL: 69.7
Total CHOL/HDL Ratio: 3
Triglycerides: 49 mg/dL (ref 0.0–149.0)
VLDL: 9.8 mg/dL (ref 0.0–40.0)

## 2013-10-13 LAB — URINALYSIS, ROUTINE W REFLEX MICROSCOPIC
BILIRUBIN URINE: NEGATIVE
Hgb urine dipstick: NEGATIVE
Ketones, ur: NEGATIVE
Leukocytes, UA: NEGATIVE
Nitrite: NEGATIVE
RBC / HPF: NONE SEEN (ref 0–?)
Specific Gravity, Urine: 1.01 (ref 1.000–1.030)
Total Protein, Urine: NEGATIVE
UROBILINOGEN UA: 0.2 (ref 0.0–1.0)
Urine Glucose: NEGATIVE
pH: 6.5 (ref 5.0–8.0)

## 2013-10-13 LAB — HEPATIC FUNCTION PANEL
ALBUMIN: 4.1 g/dL (ref 3.5–5.2)
ALK PHOS: 91 U/L (ref 39–117)
ALT: 19 U/L (ref 0–53)
AST: 19 U/L (ref 0–37)
BILIRUBIN DIRECT: 0.1 mg/dL (ref 0.0–0.3)
Total Bilirubin: 0.6 mg/dL (ref 0.2–1.2)
Total Protein: 6.4 g/dL (ref 6.0–8.3)

## 2013-10-13 LAB — CBC WITH DIFFERENTIAL/PLATELET
BASOS ABS: 0 10*3/uL (ref 0.0–0.1)
Basophils Relative: 0.4 % (ref 0.0–3.0)
Eosinophils Absolute: 0.2 10*3/uL (ref 0.0–0.7)
Eosinophils Relative: 2.7 % (ref 0.0–5.0)
HCT: 38.7 % — ABNORMAL LOW (ref 39.0–52.0)
HEMOGLOBIN: 13.1 g/dL (ref 13.0–17.0)
Lymphocytes Relative: 22.2 % (ref 12.0–46.0)
Lymphs Abs: 1.4 10*3/uL (ref 0.7–4.0)
MCHC: 33.8 g/dL (ref 30.0–36.0)
MCV: 96.8 fl (ref 78.0–100.0)
MONOS PCT: 6.3 % (ref 3.0–12.0)
Monocytes Absolute: 0.4 10*3/uL (ref 0.1–1.0)
Neutro Abs: 4.3 10*3/uL (ref 1.4–7.7)
Neutrophils Relative %: 68.4 % (ref 43.0–77.0)
PLATELETS: 191 10*3/uL (ref 150.0–400.0)
RBC: 4 Mil/uL — ABNORMAL LOW (ref 4.22–5.81)
RDW: 14.2 % (ref 11.5–15.5)
WBC: 6.3 10*3/uL (ref 4.0–10.5)

## 2013-10-13 LAB — TSH: TSH: 1.8 u[IU]/mL (ref 0.35–4.50)

## 2013-10-13 LAB — PSA: PSA: 1.21 ng/mL (ref 0.10–4.00)

## 2013-10-13 LAB — MICROALBUMIN / CREATININE URINE RATIO
CREATININE, U: 83.3 mg/dL
MICROALB/CREAT RATIO: 1 mg/g (ref 0.0–30.0)
Microalb, Ur: 0.8 mg/dL (ref 0.0–1.9)

## 2013-10-13 LAB — HEMOGLOBIN A1C: Hgb A1c MFr Bld: 6 % (ref 4.6–6.5)

## 2013-10-13 MED ORDER — FUROSEMIDE 40 MG PO TABS: 40.0000 mg | ORAL_TABLET | Freq: Every morning | ORAL | Status: DC

## 2013-10-13 NOTE — Assessment & Plan Note (Signed)
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
## 2013-10-13 NOTE — Progress Notes (Signed)
Pre visit review using our clinic review tool, if applicable. No additional management support is needed unless otherwise documented below in the visit note. 

## 2013-10-13 NOTE — Addendum Note (Signed)
Addended by: Sharon Seller B on: 10/13/2013 10:55 AM   Modules accepted: Orders

## 2013-10-13 NOTE — Patient Instructions (Addendum)
You had the new Prevnar pnuemonia shot today  You will be contacted regarding the referral for: podiatry  Please continue all other medications as before, and refills have been done if requested.  Please have the pharmacy call with any other refills you may need.  Please continue your efforts at being more active, low cholesterol diet, and weight control.  You are otherwise up to date with prevention measures today.  Please keep your appointments with your specialists as you may have planned  You will be contacted by phone if any changes need to be made immediately about lab work.  Otherwise, you will receive a letter about your results with an explanation, but please check with MyChart first.  Please return in 6 months, or sooner if needed, with Lab testing done 3-5 days before

## 2013-10-13 NOTE — Assessment & Plan Note (Signed)
For f/u lab, pt to go to ER for persistent bleeding, declines gi referral

## 2013-10-13 NOTE — Progress Notes (Signed)
   Subjective:    Patient ID: Jon Hayden, male    DOB: [birth year], 73 y.o.   MRN: [id number]  HPI this note opened in error, pt not seen    Review of Systems     Objective:   Physical Exam        Assessment & Plan:

## 2013-10-13 NOTE — Assessment & Plan Note (Signed)
stable overall by history and exam, recent data reviewed with pt, and pt to continue medical treatment as before,  to f/u any worsening symptoms or concerns For f/u a1c

## 2013-10-13 NOTE — Assessment & Plan Note (Signed)
Improved with irrigation 

## 2013-10-13 NOTE — Assessment & Plan Note (Signed)
stable overall by history and exam, recent data reviewed with pt, and pt to continue medical treatment as before,  to f/u any worsening symptoms or concerns  BP Readings from Last 3 Encounters:  10/13/13 130/68  09/03/13 144/82  08/27/13 132/82

## 2013-10-22 NOTE — Telephone Encounter (Signed)
KC please advise. Thanks. 

## 2013-10-22 NOTE — Telephone Encounter (Signed)
done

## 2013-10-22 NOTE — Telephone Encounter (Signed)
Form completed and faxed to [employer] at [phone]. [place] Bing, CMA

## 2013-11-09 ENCOUNTER — Ambulatory Visit: Payer: Self-pay | Admitting: Podiatry

## 2013-11-11 ENCOUNTER — Encounter: Payer: Self-pay | Admitting: Podiatry

## 2013-11-11 ENCOUNTER — Ambulatory Visit (INDEPENDENT_AMBULATORY_CARE_PROVIDER_SITE_OTHER): Payer: BC Managed Care – PPO | Admitting: Podiatry

## 2013-11-11 VITALS — BP 168/85 | HR 70 | Resp 18 | Ht 75.5 in | Wt 278.0 lb

## 2013-11-11 DIAGNOSIS — M79609 Pain in unspecified limb: Secondary | ICD-10-CM

## 2013-11-11 DIAGNOSIS — E1149 Type 2 diabetes mellitus with other diabetic neurological complication: Secondary | ICD-10-CM

## 2013-11-11 DIAGNOSIS — B351 Tinea unguium: Secondary | ICD-10-CM

## 2013-11-11 DIAGNOSIS — M79676 Pain in unspecified toe(s): Secondary | ICD-10-CM

## 2013-11-11 NOTE — Patient Instructions (Signed)
Apply topical antibiotic ointment daily to the end of left big toe cover with a Band-Aid until a scab forms  Diabetes and Foot Care Diabetes may cause you to have problems because of poor blood supply (circulation) to your feet and legs. This may cause the skin on your feet to become thinner, break easier, and heal more slowly. Your skin may become dry, and the skin may peel and crack. You may also have nerve damage in your legs and feet causing decreased feeling in them. You may not notice minor injuries to your feet that could lead to infections or more serious problems. Taking care of your feet is one of the most important things you can do for yourself.  HOME CARE INSTRUCTIONS  Wear shoes at all times, even in the house. Do not go barefoot. Bare feet are easily injured.  Check your feet daily for blisters, cuts, and redness. If you cannot see the bottom of your feet, use a mirror or ask someone for help.  Wash your feet with warm water (do not use hot water) and mild soap. Then pat your feet and the areas between your toes until they are completely dry. Do not soak your feet as this can dry your skin.  Apply a moisturizing lotion or petroleum jelly (that does not contain alcohol and is unscented) to the skin on your feet and to dry, brittle toenails. Do not apply lotion between your toes.  Trim your toenails straight across. Do not dig under them or around the cuticle. File the edges of your nails with an emery board or nail file.  Do not cut corns or calluses or try to remove them with medicine.  Wear clean socks or stockings every day. Make sure they are not too tight. Do not wear knee-high stockings since they may decrease blood flow to your legs.  Wear shoes that fit properly and have enough cushioning. To break in new shoes, wear them for just a few hours a day. This prevents you from injuring your feet. Always look in your shoes before you put them on to be sure there are no objects  inside.  Do not cross your legs. This may decrease the blood flow to your feet.  If you find a minor scrape, cut, or break in the skin on your feet, keep it and the skin around it clean and dry. These areas may be cleansed with mild soap and water. Do not cleanse the area with peroxide, alcohol, or iodine.  When you remove an adhesive bandage, be sure not to damage the skin around it.  If you have a wound, look at it several times a day to make sure it is healing.  Do not use heating pads or hot water bottles. They may burn your skin. If you have lost feeling in your feet or legs, you may not know it is happening until it is too late.  Make sure your health care provider performs a complete foot exam at least annually or more often if you have foot problems. Report any cuts, sores, or bruises to your health care provider immediately. SEEK MEDICAL CARE IF:   You have an injury that is not healing.  You have cuts or breaks in the skin.  You have an ingrown nail.  You notice redness on your legs or feet.  You feel burning or tingling in your legs or feet.  You have pain or cramps in your legs and feet.  Your legs  or feet are numb.  Your feet always feel cold. SEEK IMMEDIATE MEDICAL CARE IF:   There is increasing redness, swelling, or pain in or around a wound.  There is a red line that goes up your leg.  Pus is coming from a wound.  You develop a fever or as directed by your health care provider.  You notice a bad smell coming from an ulcer or wound. Document Released: 03/02/2000 Document Revised: 11/05/2012 Document Reviewed: 08/12/2012 [facility] Patient Information 2015 [place], [place]. This information is not intended to replace advice given to you by your health care provider. Make sure you discuss any questions you have with your health care provider.

## 2013-11-11 NOTE — Progress Notes (Signed)
   Subjective:    Patient ID: Jon Hayden, male    DOB: 02/17/1941, 73 y.o.   MRN: [id number]  HPI Comments: N debridement and diabetic foot exam L 10 toenails and feet D and O long-term C elongated, thickened toenails A diabetes and difficulty cutting T Dr. Jenny Reichmann referred  Diabetes Associated symptoms include chest pain.      Review of Systems  HENT: Positive for congestion, hearing loss and sneezing.   Eyes: Positive for pain and itching.       Glaucoma  Respiratory: Positive for cough and shortness of breath.   Cardiovascular: Positive for chest pain and leg swelling.       Calf pain with walking  Musculoskeletal: Positive for arthralgias, back pain, gait problem and myalgias.       Currently being treated for torn achilles tendon by [facility]  Skin:       keloids  Neurological: Positive for light-headedness.  Hematological: Bruises/bleeds easily.       Slow to heal  All other systems reviewed and are negative.      Objective:   Physical Exam  Orientated x3 white male  Vascular: DP and PT pulses 2/4 bilaterally  Neurological: Sensation to 10 g monofilament wire intact 4/5 right and 3/5 left Vibratory sensation diminished bilaterally Ankle reflex equal and reactive bilaterally  Dermatological: No hair growth  bilaterally Elongated, discolored, brittle, incurvated toenails 6-10  Musculoskeletal: Medium arch contour noted bilaterally Adductovarus fourth and fifth toes bilaterally No restriction of ankle, subtalar, midtarsal joints bilaterally         Assessment & Plan:   Assessment: Satisfactory vascular status Diabetic peripheral neuropathy Onychomycoses with symptoms 6-10  Plan: Debrided toenails x10  Distal bleeding left hallux. Antibiotic dressing applied Patient advised to continue apply antibiotic ointment daily to the distal left hallux until a scab forms  Reappoint at three-month intervals

## 2013-11-12 ENCOUNTER — Encounter: Payer: Self-pay | Admitting: Podiatry

## 2013-11-12 LAB — HM DIABETES FOOT EXAM

## 2013-11-13 ENCOUNTER — Encounter: Payer: Self-pay | Admitting: Internal Medicine

## 2013-11-13 ENCOUNTER — Ambulatory Visit (INDEPENDENT_AMBULATORY_CARE_PROVIDER_SITE_OTHER): Payer: BC Managed Care – PPO | Admitting: Internal Medicine

## 2013-11-13 VITALS — BP 152/82 | HR 74 | Temp 98.2°F | Wt 278.0 lb

## 2013-11-13 DIAGNOSIS — E785 Hyperlipidemia, unspecified: Secondary | ICD-10-CM

## 2013-11-13 DIAGNOSIS — J438 Other emphysema: Secondary | ICD-10-CM

## 2013-11-13 DIAGNOSIS — E119 Type 2 diabetes mellitus without complications: Secondary | ICD-10-CM

## 2013-11-13 DIAGNOSIS — Z23 Encounter for immunization: Secondary | ICD-10-CM

## 2013-11-13 DIAGNOSIS — I10 Essential (primary) hypertension: Secondary | ICD-10-CM

## 2013-11-13 MED ORDER — ALBUTEROL SULFATE HFA 108 (90 BASE) MCG/ACT IN AERS: 2.0000 | INHALATION_SPRAY | Freq: Four times a day (QID) | RESPIRATORY_TRACT | Status: DC | PRN

## 2013-11-13 MED ORDER — OLMESARTAN MEDOXOMIL 40 MG PO TABS: 40.0000 mg | ORAL_TABLET | Freq: Every day | ORAL | Status: DC

## 2013-11-13 NOTE — Assessment & Plan Note (Signed)
With recent dyspnea, mild , with obesity as well, for proair hfa trial, consider spiriva

## 2013-11-13 NOTE — Telephone Encounter (Signed)
Ok for PA for benicar, as failed micardis with persistent elev BP, also on 2 other meds

## 2013-11-13 NOTE — Assessment & Plan Note (Signed)
stable overall by history and exam, recent data reviewed with pt, and pt to continue medical treatment as before,  to f/u any worsening symptoms or concerns Lab Results  Component Value Date   LDLCALC 60 10/13/2013

## 2013-11-13 NOTE — Progress Notes (Signed)
Pre visit review using our clinic review tool, if applicable. No additional management support is needed unless otherwise documented below in the visit note. 
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
## 2013-11-13 NOTE — Assessment & Plan Note (Signed)
Uncontrolled, etiology unclear, for change micardis to benicar 40 mg qd, consider add BB if not improved but watch for over controlled HR with neg chronotropy

## 2013-11-13 NOTE — Patient Instructions (Signed)
OK to stop the Micardis  Please take all new medication as prescribed - the Benicar 40 mg per day (this is highest dose), as well as the Proair HFA for trial to see if helps with breathing  Please continue to monitor your BP at home; your goal is to be less than 140/90; please call in 10-14 days if this is not improved  Please continue all other medications as before,  Please have the pharmacy call with any other refills you may need.  Please continue your efforts at being more active, low cholesterol diet, and weight control.  No further lab work needed today

## 2013-11-13 NOTE — Assessment & Plan Note (Signed)
stable overall by history and exam, recent data reviewed with pt, and pt to continue medical treatment as before,  to f/u any worsening symptoms or concerns Lab Results  Component Value Date   HGBA1C 6.0 10/13/2013

## 2013-11-14 ENCOUNTER — Encounter: Payer: Self-pay | Admitting: Internal Medicine

## 2013-11-16 ENCOUNTER — Encounter: Payer: Self-pay | Admitting: Internal Medicine

## 2013-11-17 ENCOUNTER — Encounter: Payer: Self-pay | Admitting: Internal Medicine

## 2013-11-17 NOTE — Telephone Encounter (Signed)
Robin to see above 

## 2013-11-19 ENCOUNTER — Telehealth: Payer: Self-pay | Admitting: Internal Medicine

## 2013-11-19 ENCOUNTER — Encounter: Payer: Self-pay | Admitting: Internal Medicine

## 2013-11-19 MED ORDER — TELMISARTAN 80 MG PO TABS: 80.0000 mg | ORAL_TABLET | Freq: Every day | ORAL | Status: DC

## 2013-11-19 NOTE — Telephone Encounter (Signed)
Ok for micardis 80 mg qd - done erx

## 2013-11-19 NOTE — Telephone Encounter (Signed)
Robin to see above 

## 2013-11-19 NOTE — Telephone Encounter (Signed)
Patient calling in regards to his benicar.  Pharmacy states they can not fill it.

## 2013-11-20 NOTE — Telephone Encounter (Signed)
Patient informed Micardis sent in and when we hear back on PA  For Benicar will let him know.

## 2013-11-24 NOTE — Telephone Encounter (Signed)
Received today PA  APPROVAL for Benicar from 10/25/2013 through 11/24/2014.  [facility] has contacted the patient.  Advise please as previous note Micardis was sent in waiting to hear on PA for Benicar.

## 2013-11-24 NOTE — Addendum Note (Signed)
Addended by: Sharon Seller B on: 11/24/2013 03:47 PM   Modules accepted: Orders

## 2013-11-24 NOTE — Telephone Encounter (Signed)
Ok to take the benicar, but d/c micardis (robin to adjust med list)

## 2013-11-24 NOTE — Telephone Encounter (Signed)
Patient informed and did adjust medication list.  Patient had been notified by pharmacy.

## 2013-11-27 ENCOUNTER — Telehealth: Payer: Self-pay

## 2013-11-27 NOTE — Telephone Encounter (Signed)
LVM for pt to call back and schedule a nurse visit to have pt blood pressure rechecked.

## 2013-12-10 ENCOUNTER — Ambulatory Visit: Payer: BC Managed Care – PPO | Admitting: *Deleted

## 2013-12-10 ENCOUNTER — Telehealth: Payer: Self-pay | Admitting: *Deleted

## 2013-12-10 VITALS — BP 140/90

## 2013-12-10 DIAGNOSIS — I10 Essential (primary) hypertension: Secondary | ICD-10-CM

## 2013-12-10 MED ORDER — AMLODIPINE BESYLATE 5 MG PO TABS: 5.0000 mg | ORAL_TABLET | Freq: Every day | ORAL | Status: DC

## 2013-12-10 NOTE — Telephone Encounter (Signed)
Pt BP recheck 164/92 after 5 minutes 140/90 Pt continues to complain of SOB with exertion

## 2013-12-10 NOTE — Addendum Note (Signed)
Addended by: Biagio Borg on: 12/10/2013 07:49 PM   Modules accepted: Orders

## 2013-12-10 NOTE — Telephone Encounter (Signed)
Ok to add amlodipine 5 qd - done erx

## 2013-12-11 MED ORDER — CLONIDINE HCL 0.1 MG PO TABS
0.1000 mg | ORAL_TABLET | Freq: Two times a day (BID) | ORAL | Status: DC
Start: 2013-12-11 — End: 2013-12-15

## 2013-12-11 NOTE — Telephone Encounter (Signed)
Spoke with pt and he stated that he is allergic to amlodipine.   Is there another alternative?   Told pt that I would call him back.

## 2013-12-11 NOTE — Telephone Encounter (Signed)
pt informed of rx change

## 2013-12-11 NOTE — Addendum Note (Signed)
Addended by: Biagio Borg on: 12/11/2013 12:31 PM   Modules accepted: Orders, Medications

## 2013-12-11 NOTE — Telephone Encounter (Signed)
Pt is not truly allergic, but did have swelling of legs with this in past  Ok to d/c this, Please take all new medication as prescribed - the clonidine

## 2013-12-14 ENCOUNTER — Encounter: Payer: Self-pay | Admitting: Internal Medicine

## 2013-12-15 NOTE — Telephone Encounter (Signed)
Robin to see above, ok to contact pt

## 2013-12-16 MED ORDER — CLONIDINE HCL 0.1 MG PO TABS: 0.1000 mg | ORAL_TABLET | Freq: Every day | ORAL | Status: AC

## 2013-12-16 NOTE — Telephone Encounter (Signed)
mychart message left  OK to take the clonidine .1 in the PM only  Cont all other meds  Cont to monitor BP and call in 1-2 wks if not adequately controlled < 140/90

## 2014-01-12 ENCOUNTER — Ambulatory Visit (INDEPENDENT_AMBULATORY_CARE_PROVIDER_SITE_OTHER): Payer: BC Managed Care – PPO | Admitting: Internal Medicine

## 2014-01-12 ENCOUNTER — Encounter: Payer: Self-pay | Admitting: Internal Medicine

## 2014-01-12 ENCOUNTER — Other Ambulatory Visit (INDEPENDENT_AMBULATORY_CARE_PROVIDER_SITE_OTHER): Payer: BC Managed Care – PPO

## 2014-01-12 ENCOUNTER — Other Ambulatory Visit: Payer: Self-pay | Admitting: Internal Medicine

## 2014-01-12 VITALS — BP 134/72 | HR 71 | Temp 98.0°F | Wt 278.0 lb

## 2014-01-12 DIAGNOSIS — K921 Melena: Secondary | ICD-10-CM

## 2014-01-12 DIAGNOSIS — E119 Type 2 diabetes mellitus without complications: Secondary | ICD-10-CM

## 2014-01-12 DIAGNOSIS — N183 Chronic kidney disease, stage 3 unspecified: Secondary | ICD-10-CM

## 2014-01-12 DIAGNOSIS — R531 Weakness: Secondary | ICD-10-CM | POA: Insufficient documentation

## 2014-01-12 DIAGNOSIS — I10 Essential (primary) hypertension: Secondary | ICD-10-CM

## 2014-01-12 DIAGNOSIS — R21 Rash and other nonspecific skin eruption: Secondary | ICD-10-CM

## 2014-01-12 LAB — BASIC METABOLIC PANEL
BUN: 33 mg/dL — ABNORMAL HIGH (ref 6–23)
CALCIUM: 9.6 mg/dL (ref 8.4–10.5)
CHLORIDE: 104 meq/L (ref 96–112)
CO2: 24 meq/L (ref 19–32)
Creatinine, Ser: 1.8 mg/dL — ABNORMAL HIGH (ref 0.4–1.5)
GFR: 38.88 mL/min — ABNORMAL LOW (ref >60.00)
GLUCOSE: 123 mg/dL — AB (ref 70–99)
POTASSIUM: 4.8 meq/L (ref 3.5–5.1)
SODIUM: 138 meq/L (ref 135–145)

## 2014-01-12 LAB — CBC WITH DIFFERENTIAL/PLATELET
BASOS PCT: 0.4 % (ref 0.0–3.0)
Basophils Absolute: 0 10*3/uL (ref 0.0–0.1)
EOS PCT: 2.8 % (ref 0.0–5.0)
Eosinophils Absolute: 0.2 10*3/uL (ref 0.0–0.7)
HCT: 41 % (ref 39.0–52.0)
HEMOGLOBIN: 13.5 g/dL (ref 13.0–17.0)
LYMPHS ABS: 1.4 10*3/uL (ref 0.7–4.0)
LYMPHS PCT: 17.1 % (ref 12.0–46.0)
MCHC: 32.9 g/dL (ref 30.0–36.0)
MCV: 97 fl (ref 78.0–100.0)
MONO ABS: 0.6 10*3/uL (ref 0.1–1.0)
MONOS PCT: 7.6 % (ref 3.0–12.0)
NEUTROS PCT: 72.1 % (ref 43.0–77.0)
Neutro Abs: 5.9 10*3/uL (ref 1.4–7.7)
PLATELETS: 192 10*3/uL (ref 150.0–400.0)
RBC: 4.22 Mil/uL (ref 4.22–5.81)
RDW: 14.9 % (ref 11.5–15.5)
WBC: 8.1 10*3/uL (ref 4.0–10.5)

## 2014-01-12 LAB — LIPID PANEL
CHOLESTEROL: 123 mg/dL (ref 0–200)
HDL: 35.3 mg/dL — ABNORMAL LOW (ref >39.00)
LDL Cholesterol: 79 mg/dL (ref 0–99)
NONHDL: 87.7
Total CHOL/HDL Ratio: 3
Triglycerides: 46 mg/dL (ref 0.0–149.0)
VLDL: 9.2 mg/dL (ref 0.0–40.0)

## 2014-01-12 LAB — URINALYSIS, ROUTINE W REFLEX MICROSCOPIC
BILIRUBIN URINE: NEGATIVE
Hgb urine dipstick: NEGATIVE
Ketones, ur: NEGATIVE
Leukocytes, UA: NEGATIVE
Nitrite: NEGATIVE
Specific Gravity, Urine: 1.02 (ref 1.000–1.030)
TOTAL PROTEIN, URINE-UPE24: NEGATIVE
Urine Glucose: NEGATIVE
Urobilinogen, UA: 0.2 (ref 0.0–1.0)
pH: 5.5 (ref 5.0–8.0)

## 2014-01-12 LAB — HEMOGLOBIN A1C: HEMOGLOBIN A1C: 5.9 % (ref 4.6–6.5)

## 2014-01-12 LAB — HEPATIC FUNCTION PANEL
ALBUMIN: 3.8 g/dL (ref 3.5–5.2)
ALK PHOS: 106 U/L (ref 39–117)
ALT: 17 U/L (ref 0–53)
AST: 19 U/L (ref 0–37)
BILIRUBIN DIRECT: 0 mg/dL (ref 0.0–0.3)
TOTAL PROTEIN: 7.5 g/dL (ref 6.0–8.3)
Total Bilirubin: 0.7 mg/dL (ref 0.2–1.2)

## 2014-01-12 LAB — TSH: TSH: 1.65 u[IU]/mL (ref 0.35–4.50)

## 2014-01-12 MED ORDER — PIOGLITAZONE HCL 30 MG PO TABS: 30.0000 mg | ORAL_TABLET | Freq: Every morning | ORAL | Status: AC

## 2014-01-12 MED ORDER — ALBUTEROL SULFATE HFA 108 (90 BASE) MCG/ACT IN AERS: 2.0000 | INHALATION_SPRAY | Freq: Four times a day (QID) | RESPIRATORY_TRACT | Status: AC | PRN

## 2014-01-12 MED ORDER — FUROSEMIDE 40 MG PO TABS: 40.0000 mg | ORAL_TABLET | Freq: Every morning | ORAL | Status: AC

## 2014-01-12 MED ORDER — OLMESARTAN MEDOXOMIL 40 MG PO TABS: ORAL_TABLET | ORAL | Status: AC

## 2014-01-12 MED ORDER — CLOTRIMAZOLE-BETAMETHASONE 1-0.05 % EX CREA: TOPICAL_CREAM | CUTANEOUS | Status: DC

## 2014-01-12 NOTE — Addendum Note (Signed)
Addended by: Biagio Borg on: 01/12/2014 10:01 AM   Modules accepted: Orders, Level of Service

## 2014-01-12 NOTE — Assessment & Plan Note (Signed)
Ok to defer to pt, to reduce the benicar to half per day for now, f/u bp (and HR on cardizem/clonidine) at home especailly with symptoms

## 2014-01-12 NOTE — Assessment & Plan Note (Signed)
Indeed appears to be c/w ringworn type, for lotrisone prn  to f/u any worsening symptoms or concerns

## 2014-01-12 NOTE — Assessment & Plan Note (Addendum)
?   Etiology, diff includes overtx for BP, sugar, HR or other drug sid effect such as clonidine, or anemia with recent weakness. I am not convinced the benicar is the issue, but will defer to pt, reduce the benicar to 20 mg for now, to also monitor BP, HR, sugar closely at home, let us know mychart in 1-2 wks if improved;   Note:  Total time for pt hx, exam, review of record with pt in the room, determination of diagnoses and plan for further eval and tx is > 40 min, with over 50% spent in coordination and counseling of patient

## 2014-01-12 NOTE — Assessment & Plan Note (Signed)
With gradual worsening over 2 yrs, may be related to medication now overtreatment effect? For f/u lab

## 2014-01-12 NOTE — Progress Notes (Signed)
Pre visit review using our clinic review tool, if applicable. No additional management support is needed unless otherwise documented below in the visit note. 

## 2014-01-12 NOTE — Patient Instructions (Signed)
OK to reduce the Benicar to 1/2 tab per day  Please continue all other medications as before  Please check your blood pressure, heart rate, and sugar carefully for further episodes weakness  Please have the pharmacy call with any other refills you may need.  Please keep your appointments with your specialists as you may have planned  Please go to the LAB in the Basement (turn left off the elevator) for the tests to be done today  You will be contacted by phone if any changes need to be made immediately.  Otherwise, you will receive a letter about your results with an explanation, but please check with MyChart first.
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
## 2014-01-12 NOTE — Assessment & Plan Note (Signed)
stable overall by history and exam, recent data reviewed with pt, and pt to continue medical treatment as before,  to f/u any worsening symptoms or concerns / Lab Results  Component Value Date   HGBA1C 6.0 10/13/2013   May be overcontrolled, but pt adamant done well in past at 5.7 - 6.0, cont same meds for nowm fu sugars at home (not done recent)

## 2014-01-12 NOTE — Assessment & Plan Note (Signed)
Agree with pt most likely recurrent divertiulcar bleed, declines other GI referral at this time, will need cbc in f/u today though given his symptoms

## 2014-01-13 ENCOUNTER — Telehealth: Payer: Self-pay | Admitting: Internal Medicine

## 2014-01-13 NOTE — Telephone Encounter (Signed)
emmi emailed °

## 2014-02-10 ENCOUNTER — Encounter: Payer: Self-pay | Admitting: Internal Medicine

## 2014-02-10 NOTE — Telephone Encounter (Signed)
PCC's to see above 

## 2014-02-17 ENCOUNTER — Encounter: Payer: Self-pay | Admitting: Podiatry

## 2014-02-17 ENCOUNTER — Ambulatory Visit (INDEPENDENT_AMBULATORY_CARE_PROVIDER_SITE_OTHER): Payer: BC Managed Care – PPO | Admitting: Podiatry

## 2014-02-17 VITALS — BP 132/70 | HR 72 | Resp 12

## 2014-02-17 DIAGNOSIS — M79676 Pain in unspecified toe(s): Secondary | ICD-10-CM

## 2014-02-17 DIAGNOSIS — B351 Tinea unguium: Secondary | ICD-10-CM

## 2014-02-17 NOTE — Progress Notes (Signed)
Patient ID: Jon Hayden, male   DOB: Jul 31, 1940, 73 y.o.   MRN: [id number]  Subjective: This patient presents again complaining of painful toenails and walking wearing shoes  Objective: Elongated, incurvated, hypertrophic toenails 6-10  Assessment: Symptomatic onychomycoses 6-10  Plan: Debrided toenails 10 without a bleeding  Reappoint 3 months

## 2014-03-16 ENCOUNTER — Other Ambulatory Visit: Payer: Self-pay | Admitting: Nephrology

## 2014-03-16 ENCOUNTER — Encounter: Payer: Self-pay | Admitting: Internal Medicine

## 2014-03-16 DIAGNOSIS — N189 Chronic kidney disease, unspecified: Secondary | ICD-10-CM

## 2014-03-16 NOTE — Telephone Encounter (Signed)
schedulers to see above

## 2014-03-18 ENCOUNTER — Ambulatory Visit
Admission: RE | Admit: 2014-03-18 | Discharge: 2014-03-18 | Disposition: A | Discharge status: Home / Self Care | Payer: BC Managed Care – PPO | Source: Ambulatory Visit | Attending: Nephrology | Admitting: Nephrology

## 2014-03-18 DIAGNOSIS — N189 Chronic kidney disease, unspecified: Secondary | ICD-10-CM

## 2014-03-23 ENCOUNTER — Ambulatory Visit (INDEPENDENT_AMBULATORY_CARE_PROVIDER_SITE_OTHER): Payer: BC Managed Care – PPO | Admitting: Internal Medicine

## 2014-03-23 ENCOUNTER — Encounter: Payer: Self-pay | Admitting: Internal Medicine

## 2014-03-23 VITALS — BP 122/64 | HR 65 | Temp 97.7°F | Ht 76.0 in | Wt 285.0 lb

## 2014-03-23 DIAGNOSIS — E119 Type 2 diabetes mellitus without complications: Secondary | ICD-10-CM

## 2014-03-23 DIAGNOSIS — N183 Chronic kidney disease, stage 3 unspecified: Secondary | ICD-10-CM

## 2014-03-23 DIAGNOSIS — I10 Essential (primary) hypertension: Secondary | ICD-10-CM
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
## 2014-03-23 NOTE — Patient Instructions (Addendum)
Ok to stop the metformin  Please continue all other medications as before, including actos  Please have the pharmacy call with any other refills you may need.  Please keep your appointments with your specialists as you may have planned  Please return in 3 months, or sooner if needed  OK to cancel the Jan 28 appt

## 2014-03-23 NOTE — Progress Notes (Signed)
Pre visit review using our clinic review tool, if applicable. No additional management support is needed unless otherwise documented below in the visit note. 

## 2014-03-25 NOTE — Assessment & Plan Note (Signed)
stable overall by history and exam, recent data reviewed with pt, and pt to continue medical treatment as before,  to f/u any worsening symptoms or concerns BP Readings from Last 3 Encounters:  03/23/14 122/64  02/17/14 132/70  01/12/14 134/72

## 2014-03-25 NOTE — Assessment & Plan Note (Signed)
Well controlled,  Lab Results  Component Value Date   HGBA1C 5.9 01/12/2014   To d/c metformin due to CKD worsening recent, would expect a1c to increase but end up < 7 overall,  to f/u any worsening symptoms or concerns

## 2014-03-25 NOTE — Assessment & Plan Note (Signed)
stable overall by history and exam, recent data reviewed with pt, and pt to continue medical treatment as before,  to f/u any worsening symptoms or concerns Lab Results  Component Value Date   CREATININE 1.8* 01/12/2014   /fu with renal as planned

## 2014-04-06 LAB — HM DIABETES EYE EXAM

## 2014-04-07 ENCOUNTER — Encounter: Payer: Self-pay | Admitting: Internal Medicine

## 2014-04-15 ENCOUNTER — Ambulatory Visit (INDEPENDENT_AMBULATORY_CARE_PROVIDER_SITE_OTHER): Payer: BC Managed Care – PPO | Admitting: Internal Medicine

## 2014-04-15 DIAGNOSIS — I10 Essential (primary) hypertension: Secondary | ICD-10-CM

## 2014-04-18 NOTE — Patient Instructions (Signed)
none

## 2014-04-18 NOTE — Assessment & Plan Note (Signed)
This note opened in error, pt not seen, no charge

## 2014-04-20 ENCOUNTER — Ambulatory Visit (INDEPENDENT_AMBULATORY_CARE_PROVIDER_SITE_OTHER): Payer: BC Managed Care – PPO | Admitting: Internal Medicine

## 2014-04-20 ENCOUNTER — Encounter: Payer: Self-pay | Admitting: Internal Medicine

## 2014-04-20 ENCOUNTER — Ambulatory Visit (INDEPENDENT_AMBULATORY_CARE_PROVIDER_SITE_OTHER)
Admission: RE | Admit: 2014-04-20 | Discharge: 2014-04-20 | Disposition: A | Discharge status: Home / Self Care | Payer: BC Managed Care – PPO | Source: Ambulatory Visit | Attending: Internal Medicine | Admitting: Internal Medicine

## 2014-04-20 VITALS — BP 140/72 | HR 72 | Temp 98.7°F | Ht 76.0 in | Wt 287.0 lb

## 2014-04-20 DIAGNOSIS — I10 Essential (primary) hypertension: Secondary | ICD-10-CM

## 2014-04-20 DIAGNOSIS — R0609 Other forms of dyspnea: Secondary | ICD-10-CM | POA: Insufficient documentation

## 2014-04-20 MED ORDER — TIOTROPIUM BROMIDE MONOHYDRATE 18 MCG IN CAPS: 18.0000 ug | ORAL_CAPSULE | Freq: Every day | RESPIRATORY_TRACT | Status: AC
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
## 2014-04-20 NOTE — Progress Notes (Signed)
Pre visit review using our clinic review tool, if applicable. No additional management support is needed unless otherwise documented below in the visit note. 

## 2014-04-20 NOTE — Patient Instructions (Signed)
Please take all new medication as prescribed  - the spiriva inhaler daily  OK to take lasix at 80 mg in am, and 40 mg in PM for 4 days, then resume 40 twice per day  Please weight yourself daily starting today, and present the information to Dr Mercy Moore at your next visit  You will be contacted regarding the referral for: echocardiogram  Please go to the XRAY Department in the Basement (go straight as you get off the elevator) for the x-ray testing  You will be contacted by phone if any changes need to be made immediately.  Otherwise, you will receive a letter about your results with an explanation, but please check with MyChart first.  Please remember to sign up for MyChart if you have not done so, as this will be important to you in the future with finding out test results, communicating by private email, and scheduling acute appointments online when needed.  Have a good time on your trip.

## 2014-04-20 NOTE — Assessment & Plan Note (Addendum)
Diff includes copd suboptimal tx, diast chf , CKD with fluid retention, obesity, vs other or some combination , labs reviewed with pt including stress test 2008 with EF 58%, no prior echo on emr  For CXR, echo r/o dCHF, trial spiriva daily, and ok for limited increased lasix 80am.40 pm for 4 days, then back to 40 bid, with f/u renal as planned  Note:  Total time for pt hx, exam, review of record with pt in the room, determination of diagnoses and plan for further eval and tx is > 40 min, with over 50% spent in coordination and counseling of patient

## 2014-04-20 NOTE — Assessment & Plan Note (Signed)

## 2014-04-29 ENCOUNTER — Other Ambulatory Visit: Payer: Self-pay | Admitting: Internal Medicine

## 2014-04-30 ENCOUNTER — Other Ambulatory Visit: Payer: Self-pay | Admitting: *Deleted

## 2014-06-01 ENCOUNTER — Telehealth: Payer: Self-pay | Admitting: Internal Medicine

## 2014-06-01 NOTE — Telephone Encounter (Signed)
Just Jon Hayden pt son called in and said that Pt passed away this past weekend while he was in [place]

## 2014-06-04 ENCOUNTER — Other Ambulatory Visit (HOSPITAL_COMMUNITY): Payer: BC Managed Care – PPO

## 2014-06-07 ENCOUNTER — Encounter (HOSPITAL_COMMUNITY): Payer: Self-pay | Admitting: Internal Medicine

## 2014-06-09 ENCOUNTER — Ambulatory Visit: Payer: BC Managed Care – PPO | Admitting: Podiatry

## 2014-06-18 DEATH — deceased

## 2014-06-23 ENCOUNTER — Ambulatory Visit: Payer: BC Managed Care – PPO | Admitting: Internal Medicine
